# Patient Record
Sex: Female | Born: 1970 | Race: Asian | Hispanic: No | Marital: Married | Smoking: Former smoker
Health system: Southern US, Community
[De-identification: ages and names within clinical notes are randomized; demographics above are authoritative.]

## PROBLEM LIST (undated history)

## (undated) DIAGNOSIS — G5600 Carpal tunnel syndrome, unspecified upper limb: Secondary | ICD-10-CM

## (undated) DIAGNOSIS — D649 Anemia, unspecified: Secondary | ICD-10-CM

## (undated) DIAGNOSIS — C801 Malignant (primary) neoplasm, unspecified: Secondary | ICD-10-CM

## (undated) DIAGNOSIS — J45909 Unspecified asthma, uncomplicated: Secondary | ICD-10-CM

## (undated) DIAGNOSIS — Z803 Family history of malignant neoplasm of breast: Secondary | ICD-10-CM

## (undated) DIAGNOSIS — C50919 Malignant neoplasm of unspecified site of unspecified female breast: Secondary | ICD-10-CM

## (undated) DIAGNOSIS — T7840XA Allergy, unspecified, initial encounter: Secondary | ICD-10-CM

## (undated) DIAGNOSIS — Z800 Family history of malignant neoplasm of digestive organs: Secondary | ICD-10-CM

## (undated) DIAGNOSIS — S8990XA Unspecified injury of unspecified lower leg, initial encounter: Secondary | ICD-10-CM

## (undated) HISTORY — DX: Family history of malignant neoplasm of breast: Z80.3

## (undated) HISTORY — DX: Allergy, unspecified, initial encounter: T78.40XA

## (undated) HISTORY — DX: Unspecified asthma, uncomplicated: J45.909

## (undated) HISTORY — DX: Carpal tunnel syndrome, unspecified upper limb: G56.00

## (undated) HISTORY — DX: Family history of malignant neoplasm of digestive organs: Z80.0

---

## 2018-05-04 LAB — HM PAP SMEAR: HM Pap smear: NORMAL

## 2018-05-20 ENCOUNTER — Other Ambulatory Visit: Payer: Self-pay | Admitting: Certified Nurse Midwife

## 2018-05-20 DIAGNOSIS — Z1231 Encounter for screening mammogram for malignant neoplasm of breast: Secondary | ICD-10-CM

## 2018-07-25 ENCOUNTER — Ambulatory Visit: Payer: Self-pay | Attending: Certified Nurse Midwife

## 2019-05-15 ENCOUNTER — Ambulatory Visit: Payer: Self-pay | Attending: Internal Medicine

## 2019-05-15 DIAGNOSIS — Z23 Encounter for immunization: Secondary | ICD-10-CM

## 2019-05-15 NOTE — Progress Notes (Signed)
   Covid-19 Vaccination Clinic  Name:  Annette Le    MRN: [id number] DOB: 1970/10/13  05/15/2019  [name] was observed post Covid-19 immunization for 15 minutes without incident. She was provided with Vaccine Information Sheet and instruction to access the V-Safe system.   [name] was instructed to call 911 with any severe reactions post vaccine: Marland Kitchen Difficulty breathing  . Swelling of face and throat  . A fast heartbeat  . A bad rash all over body  . Dizziness and weakness   Immunizations Administered    Name Date Dose VIS Date Route   Pfizer COVID-19 Vaccine 05/15/2019  3:33 PM 0.3 mL 01/30/2019 Intramuscular   Manufacturer: [place]   Lot: [id number]   [place]: [id number]

## 2019-06-05 ENCOUNTER — Ambulatory Visit: Payer: Self-pay | Attending: Internal Medicine

## 2019-06-05 DIAGNOSIS — Z23 Encounter for immunization: Secondary | ICD-10-CM

## 2019-06-05 NOTE — Progress Notes (Signed)
   Covid-19 Vaccination Clinic  Name:  Annette Le    MRN: [id number] DOB: 01/07/1971  06/05/2019  Annette Le was observed post Covid-19 immunization for 15 minutes without incident. She was provided with Vaccine Information Sheet and instruction to access the V-Safe system.   Annette Le was instructed to call 911 with any severe reactions post vaccine: Marland Kitchen Difficulty breathing  . Swelling of face and throat  . A fast heartbeat  . A bad rash all over body  . Dizziness and weakness   Immunizations Administered    Name Date Dose VIS Date Route   Pfizer COVID-19 Vaccine 06/05/2019  3:56 PM 0.3 mL 01/30/2019 Intramuscular   Manufacturer: [place]   Lot: [id number]   [place]: [id number]

## 2019-11-02 ENCOUNTER — Ambulatory Visit
Admission: EM | Admit: 2019-11-02 | Discharge: 2019-11-02 | Disposition: A | Discharge status: Home / Self Care | Payer: 59 | Attending: Emergency Medicine | Admitting: Emergency Medicine

## 2019-11-02 DIAGNOSIS — S46819A Strain of other muscles, fascia and tendons at shoulder and upper arm level, unspecified arm, initial encounter: Secondary | ICD-10-CM

## 2019-11-02 HISTORY — DX: Unspecified injury of unspecified lower leg, initial encounter: S89.90XA

## 2019-11-02 MED ORDER — CYCLOBENZAPRINE HCL 5 MG PO TABS: 5.0000 mg | ORAL_TABLET | Freq: Every evening | ORAL | 0 refills | Status: DC | PRN

## 2019-11-02 MED ORDER — NAPROXEN 500 MG PO TABS: 500.0000 mg | ORAL_TABLET | Freq: Two times a day (BID) | ORAL | 0 refills | Status: DC

## 2019-11-02 NOTE — ED Provider Notes (Signed)
Roderic Palau    CSN: [id number] Arrival date & time: 11/02/19  0807      History   Chief Complaint Chief Complaint  Patient presents with  . Back Pain    HPI Annette Le is a 49 y.o. female  presents for bilateral upper back pain.  States pain is nonradiating, has been going on for the greater part of 2 weeks.  Has tried ibuprofen with pain out relief.  Denies trauma/injury to the affected area and does not recall an inciting event.  Denies fever, saddle area anesthesia, lower extremity numbness/weakness, urinary retention, fecal incontinence.  Does endorse heavy lifting at work.  No chest pain, difficulty breathing, neck pain.  Past Medical History:  Diagnosis Date  . Knee injury     There are no problems to display for this patient.   History reviewed. No pertinent surgical history.  OB History   No obstetric history on file.      Home Medications    Prior to Admission medications   Medication Sig Start Date End Date Taking? Authorizing Provider  cyclobenzaprine (FLEXERIL) 5 MG tablet Take 1 tablet (5 mg total) by mouth at bedtime as needed for muscle spasms. 11/02/19   Hall-Potvin, [place], PA-C  naproxen (NAPROSYN) 500 MG tablet Take 1 tablet (500 mg total) by mouth 2 (two) times daily. 11/02/19   Hall-Potvin, [place], PA-C    Family History No family history on file.  Social History Social History   Tobacco Use  . Smoking status: Never Smoker  Substance Use Topics  . Alcohol use: Yes  . Drug use: Not on file     Allergies   Shrimp [shellfish allergy]   Review of Systems As per HPI   Physical Exam Triage Vital Signs ED Triage Vitals  Enc Vitals Group     BP 11/02/19 0818 116/82     Pulse Rate 11/02/19 0818 78     Resp 11/02/19 0818 16     Temp 11/02/19 0818 97.8 F (36.6 C)     Temp src --      SpO2 11/02/19 0818 97 %     Weight --      Height --      Head Circumference --      Peak Flow --      Pain Score 11/02/19 0808  9     Pain Loc --      Pain Edu? --      Excl. in [place]? --    No data found.  Updated Vital Signs BP 116/82   Pulse 78   Temp 97.8 F (36.6 C)   Resp 16   LMP 10/19/2019 (Within Days)   SpO2 97%   Visual Acuity Right Eye Distance:   Left Eye Distance:   Bilateral Distance:    Right Eye Near:   Left Eye Near:    Bilateral Near:     Physical Exam Constitutional:      General: She is not in acute distress. HENT:     Head: Normocephalic and atraumatic.  Eyes:     General: No scleral icterus.    Pupils: Pupils are equal, round, and reactive to light.  Cardiovascular:     Rate and Rhythm: Normal rate.  Pulmonary:     Effort: Pulmonary effort is normal.  Musculoskeletal:     Cervical back: Normal range of motion. No tenderness.     Comments: Full active ROM of upper extremities bilaterally.  Neurovascular intact.  Patient does  not have spinous process tenderness in cervical or thoracic spine.  Does have significant trapezius tenderness without mass or crepitus (L>R)  Skin:    Coloration: Skin is not jaundiced or pale.  Neurological:     Mental Status: She is alert and oriented to person, place, and time.      UC Treatments / Results  Labs (all labs ordered are listed, but only abnormal results are displayed) Labs Reviewed - No data to display  EKG   Radiology No results found.  Procedures Procedures (including critical care time)  Medications Ordered in UC Medications - No data to display  Initial Impression / Assessment and Plan / UC Course  I have reviewed the triage vital signs and the nursing notes.  Pertinent labs & imaging results that were available during my care of the patient were reviewed by me and considered in my medical decision making (see chart for details).     H&P consistent with trapezius strain: We will treat supportively as outlined below.  Work note provided. Return precautions discussed, ptis verbalized understanding and is  agreeable to plan. Final Clinical Impressions(s) / UC Diagnoses   Final diagnoses:  Trapezius strain, unspecified laterality, initial encounter     Discharge Instructions     Heat therapy (hot compress, warm wash rag, hot showers, etc.) can help relax muscles and soothe muscle aches. Cold therapy (ice packs) can be used to help swelling both after injury and after prolonged use of areas of chronic pain/aches.  Pain medication:  500 mg Naprosyn/Aleve (naproxen) every 12 hours with food:  AVOID other NSAIDs while taking this (may have Tylenol).  May take muscle relaxer as needed for severe pain / spasm.  (This medication may cause you to become tired so it is important you do not drink alcohol or operate heavy machinery while on this medication.  Recommend your first dose to be taken before bedtime to monitor for side effects safely)  Important to follow up with specialist(s) below for further evaluation/management if your symptoms persist or worsen.    ED Prescriptions    Medication Sig Dispense Auth. Provider   cyclobenzaprine (FLEXERIL) 5 MG tablet Take 1 tablet (5 mg total) by mouth at bedtime as needed for muscle spasms. 15 tablet Hall-Potvin, [place], PA-C   naproxen (NAPROSYN) 500 MG tablet Take 1 tablet (500 mg total) by mouth 2 (two) times daily. 30 tablet Hall-Potvin, [place], PA-C     I have reviewed the PDMP during this encounter.   Hall-Potvin, [place], [place] 11/02/19 [phone]

## 2019-11-02 NOTE — Discharge Instructions (Signed)

## 2019-11-02 NOTE — ED Triage Notes (Signed)
Patient here with upper back pain x2 weeks. Reports she has been taking ibuprofen.

## 2019-12-03 ENCOUNTER — Encounter: Payer: Self-pay | Admitting: *Deleted

## 2019-12-03 ENCOUNTER — Other Ambulatory Visit: Payer: Self-pay

## 2019-12-03 ENCOUNTER — Ambulatory Visit
Admission: EM | Admit: 2019-12-03 | Discharge: 2019-12-03 | Disposition: A | Discharge status: Home / Self Care | Payer: 59 | Attending: Emergency Medicine | Admitting: Emergency Medicine

## 2019-12-03 DIAGNOSIS — M79601 Pain in right arm: Secondary | ICD-10-CM

## 2019-12-03 DIAGNOSIS — R202 Paresthesia of skin: Secondary | ICD-10-CM

## 2019-12-03 MED ORDER — IBUPROFEN 600 MG PO TABS: 600.0000 mg | ORAL_TABLET | Freq: Four times a day (QID) | ORAL | 0 refills | Status: DC | PRN

## 2019-12-03 NOTE — ED Triage Notes (Addendum)
Patient reports history of muscle strain to upper back pain aprox 1 month ago. Was seen here for initial visit and then followed up with [facility] per workers comp, was placed on prednisone and zanaflex.   13 days ago reports started having numbness/tingling to right and pointer finger to right hand. With time has spread to right shoulder and face. States that feeling sensation is different.

## 2019-12-03 NOTE — ED Provider Notes (Signed)
Roderic Palau    CSN: [id number] Arrival date & time: 12/03/19  1524      History   Chief Complaint Chief Complaint  Patient presents with  . Arm Pain    HPI Annette Le is a 49 y.o. female.   Patient presents with 2-week history of intermittent pain, numbness, paresthesias, and swelling of her right fingers, hand, arm, and shoulder.  She states the pain is intermittent and worse in the mornings after she wakes up.  She is not currently having any pain or other symptoms.  She also reports the pain intermittently spreads to her right face.  No treatments attempted at home.  She denies rash, lesions, erythema, warmth, fever, chills, or other symptoms.  Patient was seen here on 11/02/2019 by PA Hall-Potvin; diagnosed with trapezius strain; treated with Flexeril and Naprosyn.  Then seen at [facility] clinic; diagnosed with neck strain and back strain; treated with prednisone and Zanaflex.  She had follow-up visits on 11/10/2019 and 11/17/2019 at [facility] clinic.  She was cleared to return to work by [facility] on 11/17/2019.  She denies current pregnancy or breastfeeding.     The history is provided by the patient and medical records.    Past Medical History:  Diagnosis Date  . Knee injury     There are no problems to display for this patient.   History reviewed. No pertinent surgical history.  OB History   No obstetric history on file.      Home Medications    Prior to Admission medications   Medication Sig Start Date End Date Taking? Authorizing Provider  cyclobenzaprine (FLEXERIL) 5 MG tablet Take 1 tablet (5 mg total) by mouth at bedtime as needed for muscle spasms. 11/02/19  Yes Hall-Potvin, [place], PA-C  ibuprofen (ADVIL) 600 MG tablet Take 1 tablet (600 mg total) by mouth every 6 (six) hours as needed. 12/03/19   Sharion Balloon, NP    Family History History reviewed. No pertinent family history.  Social History Social History   Tobacco Use  .  Smoking status: Never Smoker  . Smokeless tobacco: Never Used  Substance Use Topics  . Alcohol use: Yes  . Drug use: Not on file     Allergies   Shrimp [shellfish allergy]   Review of Systems Review of Systems  Constitutional: Negative for chills and fever.  HENT: Negative for ear pain and sore throat.   Eyes: Negative for pain and visual disturbance.  Respiratory: Negative for cough and shortness of breath.   Cardiovascular: Negative for chest pain and palpitations.  Gastrointestinal: Negative for abdominal pain and vomiting.  Genitourinary: Negative for dysuria and hematuria.  Musculoskeletal: Positive for arthralgias. Negative for back pain.  Skin: Negative for color change and rash.  Neurological: Negative for seizures and syncope.  All other systems reviewed and are negative.    Physical Exam Triage Vital Signs ED Triage Vitals  Enc Vitals Group     BP 12/03/19 1538 108/75     Pulse Rate 12/03/19 1538 83     Resp 12/03/19 1538 15     Temp 12/03/19 1538 98.6 F (37 C)     Temp Source 12/03/19 1538 Oral     SpO2 12/03/19 1538 96 %     Weight --      Height --      Head Circumference --      Peak Flow --      Pain Score 12/03/19 1534 0  Pain Loc --      Pain Edu? --      Excl. in [place]? --    No data found.  Updated Vital Signs BP 108/75 (BP Location: Left Arm)   Pulse 83   Temp 98.6 F (37 C) (Oral)   Resp 15   SpO2 96%   Visual Acuity Right Eye Distance:   Left Eye Distance:   Bilateral Distance:    Right Eye Near:   Left Eye Near:    Bilateral Near:     Physical Exam Vitals and nursing note reviewed.  Constitutional:      General: She is not in acute distress.    Appearance: She is well-developed. She is not ill-appearing.  HENT:     Head: Normocephalic and atraumatic.     Mouth/Throat:     Mouth: Mucous membranes are moist.  Eyes:     Conjunctiva/sclera: Conjunctivae normal.  Cardiovascular:     Rate and Rhythm: Normal rate and  regular rhythm.     Heart sounds: No murmur heard.   Pulmonary:     Effort: Pulmonary effort is normal. No respiratory distress.     Breath sounds: Normal breath sounds.  Abdominal:     Palpations: Abdomen is soft.     Tenderness: There is no abdominal tenderness.  Musculoskeletal:        General: No swelling, tenderness, deformity or signs of injury. Normal range of motion.     Cervical back: Neck supple.  Skin:    General: Skin is warm and dry.     Capillary Refill: Capillary refill takes less than 2 seconds.     Findings: No bruising, erythema, lesion or rash.  Neurological:     General: No focal deficit present.     Mental Status: She is alert and oriented to person, place, and time.     Cranial Nerves: No cranial nerve deficit.     Sensory: No sensory deficit.     Motor: No weakness.     Coordination: Coordination normal.     Gait: Gait normal.  Psychiatric:        Mood and Affect: Mood normal.        Behavior: Behavior normal.      UC Treatments / Results  Labs (all labs ordered are listed, but only abnormal results are displayed) Labs Reviewed - No data to display  EKG   Radiology No results found.  Procedures Procedures (including critical care time)  Medications Ordered in UC Medications - No data to display  Initial Impression / Assessment and Plan / UC Course  I have reviewed the triage vital signs and the nursing notes.  Pertinent labs & imaging results that were available during my care of the patient were reviewed by me and considered in my medical decision making (see chart for details).   Right arm pain and paresthesias.  Patient is well-appearing and her exam is reassuring.  She is currently asymptomatic.  Treating with ibuprofen.  Instructed her to follow-up with orthopedics if her symptoms are not improving.  Patient agrees to plan of care.   Final Clinical Impressions(s) / UC Diagnoses   Final diagnoses:  Right arm pain  Paresthesias      Discharge Instructions     Take the ibuprofen as prescribed.    Follow up with an orthopedist if your symptoms are not improving.         ED Prescriptions    Medication Sig Dispense Auth. Provider  ibuprofen (ADVIL) 600 MG tablet Take 1 tablet (600 mg total) by mouth every 6 (six) hours as needed. 30 tablet Sharion Balloon, NP     PDMP not reviewed this encounter.   Sharion Balloon, NP 12/03/19 1600

## 2019-12-03 NOTE — Discharge Instructions (Signed)
Take the ibuprofen as prescribed.    Follow up with an orthopedist if your symptoms are not improving.

## 2020-02-03 ENCOUNTER — Ambulatory Visit: Payer: 59 | Admitting: Family Medicine

## 2020-02-10 ENCOUNTER — Other Ambulatory Visit: Payer: Self-pay

## 2020-02-10 ENCOUNTER — Ambulatory Visit (INDEPENDENT_AMBULATORY_CARE_PROVIDER_SITE_OTHER): Payer: 59 | Admitting: Family Medicine

## 2020-02-10 ENCOUNTER — Encounter: Payer: Self-pay | Admitting: Family Medicine

## 2020-02-10 VITALS — BP 108/71 | HR 73 | Temp 97.3°F | Resp 18 | Ht 61.5 in | Wt 159.6 lb

## 2020-02-10 DIAGNOSIS — Z7689 Persons encountering health services in other specified circumstances: Secondary | ICD-10-CM

## 2020-02-10 NOTE — Patient Instructions (Signed)
We will plan to see you back in 6 months for your physical and as needed if any acute concerns arise  You will receive a survey after today's visit either digitally by e-mail or paper by [place] mail. Your experiences and feedback matter to [place].  Please respond so we know how we are doing as we provide care for you.  Call us with any questions/concerns/needs.  It is my goal to be available to you for your health concerns.  Thanks for choosing me to be a partner in your healthcare needs!  Harlin Rain, FNP-C Family Nurse Practitioner [facility] Phone: [phone]

## 2020-02-10 NOTE — Assessment & Plan Note (Signed)
New patient establishment at [facility] for primary care.  Will request records from previous PCP with [facility] and [facility].  Will RTC PRN and in 6 months for CPE.

## 2020-02-10 NOTE — Progress Notes (Signed)
Subjective:    Patient ID: Annette Le, female    DOB: [birth year], 49 y.o.   MRN: [id number]  Annette Le is a 49 y.o. female presenting on 02/10/2020 for Establish Care   HPI  Mr. Riesen presents to clinic as new patient to establish for primary care.  Previous PCP was at [facility] and [facility].  Records will be be requested.  Past medical, family, and surgical history reviewed w/ pt.  She has no acute concerns today.  Depression screen PHQ 2/9 02/10/2020  Decreased Interest 0  Down, Depressed, Hopeless 0  PHQ - 2 Score 0    Social History   Tobacco Use  . Smoking status: Former Smoker    Quit date: 02/09/2005    Years since quitting: 15.0  . Smokeless tobacco: Never Used  Vaping Use  . Vaping Use: Never used  Substance Use Topics  . Alcohol use: Yes  . Drug use: Never    Review of Systems  Constitutional: Negative.   HENT: Negative.   Eyes: Negative.   Respiratory: Negative.   Cardiovascular: Negative.   Gastrointestinal: Negative.   Endocrine: Negative.   Genitourinary: Negative.   Musculoskeletal: Negative.   Skin: Negative.   Allergic/Immunologic: Negative.   Neurological: Negative.   Hematological: Negative.   Psychiatric/Behavioral: Negative.    Per HPI unless specifically indicated above     Objective:    BP 108/71 (BP Location: Right Arm, Patient Position: Sitting, Cuff Size: Normal)   Pulse 73   Temp (!) 97.3 F (36.3 C) (Temporal)   Resp 18   Ht 5' 1.5" (1.562 m)   Wt 159 lb 9.6 oz (72.4 kg)   SpO2 100%   BMI 29.67 kg/m   Wt Readings from Last 3 Encounters:  02/10/20 159 lb 9.6 oz (72.4 kg)    Physical Exam Vitals and nursing note reviewed.  Constitutional:      General: She is not in acute distress.    Appearance: Normal appearance. She is well-developed, well-groomed and overweight. She is not ill-appearing or toxic-appearing.  HENT:     Head: Normocephalic and atraumatic.     Nose:     Comments: Lesia Sago is in  place, covering mouth and nose. Eyes:     General: Lids are normal. Vision grossly intact.        Right eye: No discharge.        Left eye: No discharge.     Extraocular Movements: Extraocular movements intact.     Conjunctiva/sclera: Conjunctivae normal.     Pupils: Pupils are equal, round, and reactive to light.  Pulmonary:     Effort: Pulmonary effort is normal. No respiratory distress.  Skin:    General: Skin is warm and dry.     Capillary Refill: Capillary refill takes less than 2 seconds.  Neurological:     General: No focal deficit present.     Mental Status: She is alert and oriented to person, place, and time.  Psychiatric:        Attention and Perception: Attention and perception normal.        Mood and Affect: Mood and affect normal.        Speech: Speech normal.        Behavior: Behavior normal. Behavior is cooperative.        Thought Content: Thought content normal.        Cognition and Memory: Cognition and memory normal.        Judgment: Judgment normal.  No results found for this or any previous visit.    Assessment & Plan:   Problem List Items Addressed This Visit      Other   Encounter to establish care with new doctor - Primary    New patient establishment at [facility] for primary care.  Will request records from previous PCP with [facility] and [facility].  Will RTC PRN and in 6 months for CPE.         No orders of the defined types were placed in this encounter.  Follow up plan: Return in about 6 months (around 08/10/2020) for CPE.   Harlin Rain, [place] Family Nurse Practitioner [facility] 02/10/2020, 2:22 PM

## 2020-06-10 ENCOUNTER — Telehealth: Payer: Self-pay

## 2020-06-10 NOTE — Telephone Encounter (Signed)
The pt was scheduled to see Dr. Raliegh Ip on Monday.

## 2020-06-10 NOTE — Telephone Encounter (Signed)
Copied from [place] [id number]. Topic: General - Other >> Jun 10, 2020 11:01 AM Leward Quan A wrote: Reason for CRM: Patient called in to to say that she need to see a doctor asking if it was possible to see Dr Raliegh Ip would be able to see her on Monday 06/13/20. She states that she have a lump / mass in her right breast that is painful when she lifts and need to be checked Ph# [phone]

## 2020-06-13 ENCOUNTER — Ambulatory Visit (INDEPENDENT_AMBULATORY_CARE_PROVIDER_SITE_OTHER): Payer: 59 | Admitting: Family Medicine

## 2020-06-13 ENCOUNTER — Other Ambulatory Visit: Payer: Self-pay

## 2020-06-13 ENCOUNTER — Encounter: Payer: Self-pay | Admitting: Family Medicine

## 2020-06-13 VITALS — BP 143/112 | HR 76 | Temp 98.4°F | Ht 61.5 in | Wt 155.0 lb

## 2020-06-13 DIAGNOSIS — N644 Mastodynia: Secondary | ICD-10-CM | POA: Diagnosis not present

## 2020-06-13 DIAGNOSIS — N6311 Unspecified lump in the right breast, upper outer quadrant: Secondary | ICD-10-CM | POA: Diagnosis not present

## 2020-06-13 NOTE — Progress Notes (Addendum)
Subjective:    Patient ID: Annette Le, female    DOB: January 25, 1971, 50 y.o.   MRN: [id number]  Annette Le is a 50 y.o. female presenting on 06/13/2020 for Breast Mass (X 3 weeks, Right breast, Painful to touch, not sure if its getting bigger, stays in the same spot when touching, marble size, feels like its deep and not on the surface )  Previous PCP Cyndia Skeeters, FNP  Accompanied by husband.  HPI   Right Breast Mass/Lump, painful Reports onset symptoms about 3 weeks ago, with painful spot or lump identified, she thinks it might be increasing in size. Taking OTC Ibuprofen / Aleve PRN with some relief and it helps other symptoms.  - She has menstrual cycles, she noticed that the menstrual cycle occurred 2 weeks after the pain started, and she has had menstrual cycle regularly. - Family history with Maternal Aunt, in [place] - with breast cancer age 50+ has recovered. - Denies any fever chills, redness, rash, drainage.  Health Maintenance: No prior mammogram.  Depression screen [facility] 2/9 06/13/2020 02/10/2020  Decreased Interest 0 0  Down, Depressed, Hopeless 0 0  PHQ - 2 Score 0 0  Altered sleeping 1 -  Tired, decreased energy 1 -  Change in appetite 0 -  Feeling bad or failure about yourself  0 -  Trouble concentrating 0 -  Moving slowly or fidgety/restless 0 -  Suicidal thoughts 0 -  PHQ-9 Score 2 -    Social History   Tobacco Use  . Smoking status: Former Smoker    Quit date: 02/09/2005    Years since quitting: 15.3  . Smokeless tobacco: Never Used  Vaping Use  . Vaping Use: Never used  Substance Use Topics  . Alcohol use: Yes  . Drug use: Never    Review of Systems Per HPI unless specifically indicated above     Objective:    BP (!) 143/112   Pulse 76   Temp 98.4 F (36.9 C) (Oral)   Ht 5' 1.5" (1.562 m)   Wt 155 lb (70.3 kg)   LMP 06/13/2020   SpO2 100%   BMI 28.81 kg/m   Wt Readings from Last 3 Encounters:  06/13/20 155 lb (70.3 kg)   02/10/20 159 lb 9.6 oz (72.4 kg)    Physical Exam Vitals and nursing note reviewed.  Constitutional:      General: She is not in acute distress.    Appearance: She is well-developed. She is not diaphoretic.     Comments: Well-appearing, comfortable, cooperative  HENT:     Head: Normocephalic and atraumatic.  Eyes:     General:        Right eye: No discharge.        Left eye: No discharge.     Conjunctiva/sclera: Conjunctivae normal.  Cardiovascular:     Rate and Rhythm: Normal rate.  Pulmonary:     Effort: Pulmonary effort is normal.  Chest:    Skin:    General: Skin is warm and dry.     Findings: No erythema or rash.  Neurological:     Mental Status: She is alert and oriented to person, place, and time.  Psychiatric:        Behavior: Behavior normal.     Comments: Well groomed, good eye contact, normal speech and thoughts    Breast exam chaperoned by Randa Evens Person CMA  No results found for this or any previous visit.    Assessment & Plan:  Problem List Items Addressed This Visit   None   Visit Diagnoses    Mass of upper outer quadrant of right breast    -  Primary   Relevant Orders   MM DIAG BREAST TOMO BILATERAL   US BREAST LTD UNI RIGHT INC AXILLA   Breast pain, right       Relevant Orders   MM DIAG BREAST TOMO BILATERAL   US BREAST LTD UNI RIGHT INC AXILLA      New onset R breast pain nodule/mass as described 3 week duration Other areas of some pain but no palpable abnormality No prior mammogram screening at age 59. fam history breast cancer maternal aunt age >56  Proceed w/ Diagnostic Mammogram Bilateral + Right breast Ultrasound - orders signed for [facility].  Orders Placed This Encounter  Procedures  . MM DIAG BREAST TOMO BILATERAL    Standing Status:   Future    Standing Expiration Date:   12/13/2020    Order Specific Question:   Reason for Exam (SYMPTOM  OR DIAGNOSIS REQUIRED)    Answer:   right breast mass 1-2 cm central upper outer,  tender to palpation 3 weeks, no prior mammogram    Order Specific Question:   Preferred imaging location?    Answer:   [facility]    Order Specific Question:   Release to patient    Answer:   Immediate  . US BREAST LTD UNI RIGHT INC AXILLA    Standing Status:   Future    Standing Expiration Date:   12/13/2020    Order Specific Question:   Reason for Exam (SYMPTOM  OR DIAGNOSIS REQUIRED)    Answer:   right breast central upper outer 1-2 cm mass, tender, 3 weeks, no prior mammogram.    Order Specific Question:   Preferred imaging location?    Answer:   [facility]     No orders of the defined types were placed in this encounter.     Follow up plan: Return if symptoms worsen or fail to improve.   Nobie Putnam, [facility] 06/13/2020, 11:03 AM

## 2020-06-13 NOTE — Patient Instructions (Addendum)
Thank you for coming to the office today.  For Mammogram screening for breast cancer   Call the [facility] below anytime to schedule your own appointment now that order has been placed.  [facility] [place], [place] [address] Phone: [phone]  Diagnostic mammogram and Ultrasound ordered.  Again most of the time this type of problems can turn out benign or something that we need to monitor and watch. Stay tuned for updates from your results.  Please schedule a Follow-up Appointment to: Return if symptoms worsen or fail to improve.  If you have any other questions or concerns, please feel free to call the office or send a message through [place]. You may also schedule an earlier appointment if necessary.  Additionally, you may be receiving a survey about your experience at our office within a few days to 1 week by e-mail or mail. We value your feedback.  Nobie Putnam, DO [place]

## 2020-06-17 ENCOUNTER — Ambulatory Visit
Admission: RE | Admit: 2020-06-17 | Discharge: 2020-06-17 | Disposition: A | Discharge status: Home / Self Care | Payer: 59 | Source: Ambulatory Visit | Attending: Family Medicine | Admitting: Family Medicine

## 2020-06-17 ENCOUNTER — Other Ambulatory Visit: Payer: Self-pay

## 2020-06-17 DIAGNOSIS — N644 Mastodynia: Secondary | ICD-10-CM

## 2020-06-17 DIAGNOSIS — N6311 Unspecified lump in the right breast, upper outer quadrant: Secondary | ICD-10-CM | POA: Diagnosis not present

## 2020-06-20 ENCOUNTER — Other Ambulatory Visit: Payer: Self-pay | Admitting: Family Medicine

## 2020-06-20 DIAGNOSIS — N6310 Unspecified lump in the right breast, unspecified quadrant: Secondary | ICD-10-CM

## 2020-06-20 DIAGNOSIS — R928 Other abnormal and inconclusive findings on diagnostic imaging of breast: Secondary | ICD-10-CM

## 2020-06-22 ENCOUNTER — Ambulatory Visit
Admission: RE | Admit: 2020-06-22 | Discharge: 2020-06-22 | Disposition: A | Discharge status: Home / Self Care | Payer: 59 | Source: Ambulatory Visit | Attending: Family Medicine | Admitting: Family Medicine

## 2020-06-22 ENCOUNTER — Other Ambulatory Visit: Payer: Self-pay

## 2020-06-22 DIAGNOSIS — R928 Other abnormal and inconclusive findings on diagnostic imaging of breast: Secondary | ICD-10-CM | POA: Diagnosis not present

## 2020-06-22 DIAGNOSIS — N6310 Unspecified lump in the right breast, unspecified quadrant: Secondary | ICD-10-CM | POA: Diagnosis present

## 2020-06-22 HISTORY — PX: BREAST BIOPSY: SHX20

## 2020-06-23 ENCOUNTER — Other Ambulatory Visit: Payer: Self-pay | Admitting: Family Medicine

## 2020-06-23 DIAGNOSIS — C50919 Malignant neoplasm of unspecified site of unspecified female breast: Secondary | ICD-10-CM

## 2020-06-23 DIAGNOSIS — N6310 Unspecified lump in the right breast, unspecified quadrant: Secondary | ICD-10-CM

## 2020-06-23 DIAGNOSIS — R928 Other abnormal and inconclusive findings on diagnostic imaging of breast: Secondary | ICD-10-CM

## 2020-06-26 NOTE — Progress Notes (Signed)
Navigation initiated.  Patient scheduled for consults with Dr. Tasia Catchings and Dr.  Christian Mate on 06/28/20.

## 2020-06-28 ENCOUNTER — Other Ambulatory Visit: Payer: Self-pay

## 2020-06-28 ENCOUNTER — Ambulatory Visit: Payer: 59 | Admitting: Surgery

## 2020-06-28 ENCOUNTER — Encounter: Payer: Self-pay | Admitting: Oncology

## 2020-06-28 ENCOUNTER — Inpatient Hospital Stay: Payer: 59

## 2020-06-28 ENCOUNTER — Inpatient Hospital Stay: Payer: 59 | Attending: Oncology | Admitting: Oncology

## 2020-06-28 VITALS — BP 101/67 | HR 76 | Temp 98.0°F | Resp 16 | Ht 61.0 in | Wt 163.0 lb

## 2020-06-28 DIAGNOSIS — C50919 Malignant neoplasm of unspecified site of unspecified female breast: Secondary | ICD-10-CM

## 2020-06-28 DIAGNOSIS — Z809 Family history of malignant neoplasm, unspecified: Secondary | ICD-10-CM

## 2020-06-28 DIAGNOSIS — M549 Dorsalgia, unspecified: Secondary | ICD-10-CM | POA: Diagnosis not present

## 2020-06-28 DIAGNOSIS — D649 Anemia, unspecified: Secondary | ICD-10-CM | POA: Insufficient documentation

## 2020-06-28 DIAGNOSIS — Z7189 Other specified counseling: Secondary | ICD-10-CM

## 2020-06-28 DIAGNOSIS — C50411 Malignant neoplasm of upper-outer quadrant of right female breast: Secondary | ICD-10-CM | POA: Insufficient documentation

## 2020-06-28 LAB — CBC WITH DIFFERENTIAL/PLATELET
Abs Immature Granulocytes: 0.03 10*3/uL (ref 0.00–0.07)
Basophils Absolute: 0.1 10*3/uL (ref 0.0–0.1)
Basophils Relative: 1 %
Eosinophils Absolute: 0.5 10*3/uL (ref 0.0–0.5)
Eosinophils Relative: 7 %
HCT: 35 % — ABNORMAL LOW (ref 36.0–46.0)
Hemoglobin: 10.9 g/dL — ABNORMAL LOW (ref 12.0–15.0)
Immature Granulocytes: 1 %
Lymphocytes Relative: 33 %
Lymphs Abs: 2.1 10*3/uL (ref 0.7–4.0)
MCH: 27 pg (ref 26.0–34.0)
MCHC: 31.1 g/dL (ref 30.0–36.0)
MCV: 86.8 fL (ref 80.0–100.0)
Monocytes Absolute: 0.5 10*3/uL (ref 0.1–1.0)
Monocytes Relative: 8 %
Neutro Abs: 3.2 10*3/uL (ref 1.7–7.7)
Neutrophils Relative %: 50 %
Platelets: 326 10*3/uL (ref 150–400)
RBC: 4.03 MIL/uL (ref 3.87–5.11)
RDW: 13.1 % (ref 11.5–15.5)
WBC: 6.4 10*3/uL (ref 4.0–10.5)
nRBC: 0 % (ref 0.0–0.2)

## 2020-06-28 LAB — COMPREHENSIVE METABOLIC PANEL
ALT: 34 U/L (ref 0–44)
AST: 26 U/L (ref 15–41)
Albumin: 3.8 g/dL (ref 3.5–5.0)
Alkaline Phosphatase: 76 U/L (ref 38–126)
Anion gap: 9 (ref 5–15)
BUN: 10 mg/dL (ref 6–20)
CO2: 23 mmol/L (ref 22–32)
Calcium: 8.7 mg/dL — ABNORMAL LOW (ref 8.9–10.3)
Chloride: 104 mmol/L (ref 98–111)
Creatinine, Ser: 0.62 mg/dL (ref 0.44–1.00)
GFR, Estimated: >60 mL/min (ref >60)
Glucose, Bld: 116 mg/dL — ABNORMAL HIGH (ref 70–99)
Potassium: 4.1 mmol/L (ref 3.5–5.1)
Sodium: 136 mmol/L (ref 135–145)
Total Bilirubin: 0.6 mg/dL (ref 0.3–1.2)
Total Protein: 6.7 g/dL (ref 6.5–8.1)

## 2020-06-28 LAB — IRON AND TIBC
Iron: 214 ug/dL — ABNORMAL HIGH (ref 28–170)
Saturation Ratios: 49 % — ABNORMAL HIGH (ref 10.4–31.8)
TIBC: 438 ug/dL (ref 250–450)
UIBC: 224 ug/dL

## 2020-06-28 LAB — FERRITIN: Ferritin: 8 ng/mL — ABNORMAL LOW (ref 11–307)

## 2020-06-28 NOTE — Progress Notes (Signed)
New patient evaluation.   

## 2020-06-28 NOTE — Progress Notes (Signed)
Hematology/Oncology Consult note [facility] Telephone:([phone] [phone]   Patient Care Team: Olin Hauser, DO as PCP - General (Family Medicine)  REFERRING PROVIDER: Nobie Putnam *  CHIEF COMPLAINTS/REASON FOR VISIT:  Evaluation of breast cancer  HISTORY OF PRESENTING ILLNESS:   Annette Le is a  50 y.o.  female with PMH listed below was seen in consultation at the request of  Nobie Putnam *  for evaluation of breast cancer.   She felt a painful right breast nodule for about 6 weeks. Went to see primary care physician and mammogram was obtained.  06/17/2020 bilateral diagnostic mammogram showed 14 x 15 x 12 mm right upper outer breast mass, 11 o'clock 2cm from nipple. close to the site of palpable/painful concern. And 2 other probably benign mass at 12 o'clock.   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  Grade 2, DCIS not identified.  LVI not identified.  Patient is scheduled to have additional ultrasound-guided biopsy of the 2 masses at 12 o'clock position of the right breast. Patient presents to establish care discussed.  She has appointment scheduled to see surgery with Dr.Rodenberg.  She was accompanied by her husband.  Family history of breast cancer: Maternal aunt was diagnosed with breast cancer at the age of 68 Family history of other cancers: Maternal aunt stomach cancer, maternal grandfather pancreatic cancer, maternal aunt lung cancer-also a smoker.  Menarche: 50 years of age  premenopausal, LMP 06/22/2020 Number of pregnancies :  Age at first live childbirth: Used OCP: Remote use for a few months. Used estrogen and progesterone therapy: Denies History of Radiation to the chest: Denies Previous of breast biopsy: Denies   Review of Systems  Constitutional: Negative for appetite change, chills, fatigue and fever.  HENT:    Negative for hearing loss and voice change.   Eyes: Negative for eye problems.  Respiratory: Negative for chest tightness and cough.   Cardiovascular: Negative for chest pain.  Gastrointestinal: Negative for abdominal distention, abdominal pain and blood in stool.  Endocrine: Negative for hot flashes.  Genitourinary: Negative for difficulty urinating and frequency.   Musculoskeletal: Positive for back pain. Negative for arthralgias.  Skin: Negative for itching and rash.  Neurological: Negative for extremity weakness.  Hematological: Negative for adenopathy.  Psychiatric/Behavioral: Negative for confusion.  right breast nodule  MEDICAL HISTORY:  Past Medical History:  Diagnosis Date  . Allergy   . Asthma   . Carpal tunnel syndrome   . Knee injury     SURGICAL HISTORY: Past Surgical History:  Procedure Laterality Date  . BREAST BIOPSY Right 06/22/2020   u/s bx @ 11:00-"vision" clip-path pending    SOCIAL HISTORY: Social History   Socioeconomic History  . Marital status: Married    Spouse name: Not on file  . Number of children: Not on file  . Years of education: Not on file  . Highest education level: Not on file  Occupational History  . Not on file  Tobacco Use  . Smoking status: Former Smoker    Quit date: 10/11/2003    Years since quitting: 16.7  . Smokeless tobacco: Never Used  Vaping Use  . Vaping Use: Never used  Substance and Sexual Activity  . Alcohol use: Yes  . Drug use: Never  . Sexual activity: Not on file  Other Topics Concern  . Not on file  Social History Narrative  . Not on file   Social Determinants  of Health   Financial Resource Strain: Not on file  Food Insecurity: Not on file  Transportation Needs: Not on file  Physical Activity: Not on file  Stress: Not on file  Social Connections: Not on file  Intimate Partner Violence: Not on file    FAMILY HISTORY: Family History  Problem Relation Age of Onset  . Breast cancer Maternal Aunt  83       recovered  . Lung cancer Maternal Aunt   . Liver cancer Maternal Aunt     ALLERGIES:  is allergic to shrimp extract allergy skin test, ibuprofen, shrimp [shellfish allergy], and tylenol [acetaminophen].  MEDICATIONS:  Current Outpatient Medications  Medication Sig Dispense Refill  . Ascorbic Acid (VITAMIN C) 500 MG CAPS Take by mouth.    . Cholecalciferol (VITAMIN D-3) 125 MCG (5000 UT) TABS Take by mouth.    . vitamin E 1000 UNIT capsule Take 1,000 Units by mouth daily.     No current facility-administered medications for this visit.     PHYSICAL EXAMINATION: ECOG PERFORMANCE STATUS: 1 - Symptomatic but completely ambulatory Vitals:   06/28/20 0952  BP: 101/67  Pulse: 76  Resp: 16  Temp: 98 F (36.7 C)   Filed Weights   06/28/20 0952  Weight: 163 lb (73.9 kg)    Physical Exam  LABORATORY DATA:  I have reviewed the data as listed Lab Results  Component Value Date   WBC 6.4 06/28/2020   HGB 10.9 (L) 06/28/2020   HCT 35.0 (L) 06/28/2020   MCV 86.8 06/28/2020   PLT 326 06/28/2020   Recent Labs    06/28/20 1102  NA 136  K 4.1  CL 104  CO2 23  GLUCOSE 116*  BUN 10  CREATININE 0.62  CALCIUM 8.7*  GFRNONAA >60  PROT 6.7  ALBUMIN 3.8  AST 26  ALT 34  ALKPHOS 76  BILITOT 0.6   Iron/TIBC/Ferritin/ %Sat No results found for: IRON, TIBC, FERRITIN, IRONPCTSAT    RADIOGRAPHIC STUDIES: I have personally reviewed the radiological images as listed and agreed with the findings in the report. US BREAST LTD UNI RIGHT INC AXILLA  Result Date: 06/17/2020 CLINICAL DATA:  Palpable painful RIGHT breast lump. EXAM: DIGITAL DIAGNOSTIC BILATERAL MAMMOGRAM WITH TOMOSYNTHESIS AND CAD; ULTRASOUND RIGHT BREAST LIMITED TECHNIQUE: Bilateral digital diagnostic mammography and breast tomosynthesis was performed. The images were evaluated with computer-aided detection.; Targeted ultrasound examination of the right breast was performed COMPARISON:  None.  Baseline ACR  Breast Density Category c: The breast tissue is heterogeneously dense, which may obscure small masses. FINDINGS: Spot compression tomosynthesis views of the site of palpable concern demonstrate an obscured oval mass subjacent to the palpable marker. There is an oval circumscribed mass in the upper retroareolar breast at anterior depth. No suspicious mass, distortion, or microcalcifications are identified to suggest presence of malignancy in the LEFT breast. On physical exam, there is a firm mobile mass at the site of palpable concern in the RIGHT upper outer breast. Targeted ultrasound was performed of the site of palpable concern. At 11 o'clock 2 cm from the nipple, there is an oval heterogeneous predominantly hypoechoic mass with mildly angular margins. It measures 14 x 15 x 12 mm. Targeted ultrasound was performed of the upper retroareolar breast. There are 2 adjacent oval circumscribed hypoechoic masses. At 12 o'clock 1 cm from the nipple, one measures 6 x 2 x 4 mm while the second at 12 o'clock in the retroareolar breast measures 6 x 7 x 3 mm. These  masses are probably benign and likely reflect complicated cysts versus benign fibroadenomas. There are multiple benign cysts noted during real-time examination. Representative simple cysts are documented at 6 o'clock 1 cm from the nipple. They measure approximately 5 mm. Targeted ultrasound was performed of the RIGHT axilla. No suspicious axillary lymph nodes are seen. IMPRESSION: 1. There is an indeterminate 15 mm mass at the site of palpable/painful concern in the RIGHT upper outer breast. Recommend ultrasound-guided biopsy for definitive characterization. 2. With benign biopsy results, recommend follow-up mammogram and ultrasound of the RIGHT breast in 6 months to evaluate for stability of the 2 probably benign masses at 12 o'clock. This will establish 6 months of definitive stability. If malignant results, recommend aspiration with potential conversion to biopsy  of these 2 masses if this will alter clinical management. 3. No suspicious RIGHT axillary adenopathy. 4. No mammographic evidence of malignancy in the LEFT breast. RECOMMENDATION: 1. RIGHT breast ultrasound-guided biopsy x1 2. With benign biopsy results, RIGHT breast diagnostic mammogram and ultrasound in 6 months of the 2 additional probably benign masses at 12 o'clock. I have discussed the findings and recommendations with the patient. If applicable, a reminder letter will be sent to the patient regarding the next appointment. Patient will be scheduled for biopsy at her earliest convenience by the schedulers and ordering provider will be notified. BI-RADS CATEGORY  4: Suspicious. Electronically Signed   By: Valentino Saxon MD   On: 06/17/2020 15:59   MM DIAG BREAST TOMO BILATERAL  Result Date: 06/17/2020 CLINICAL DATA:  Palpable painful RIGHT breast lump. EXAM: DIGITAL DIAGNOSTIC BILATERAL MAMMOGRAM WITH TOMOSYNTHESIS AND CAD; ULTRASOUND RIGHT BREAST LIMITED TECHNIQUE: Bilateral digital diagnostic mammography and breast tomosynthesis was performed. The images were evaluated with computer-aided detection.; Targeted ultrasound examination of the right breast was performed COMPARISON:  None.  Baseline ACR Breast Density Category c: The breast tissue is heterogeneously dense, which may obscure small masses. FINDINGS: Spot compression tomosynthesis views of the site of palpable concern demonstrate an obscured oval mass subjacent to the palpable marker. There is an oval circumscribed mass in the upper retroareolar breast at anterior depth. No suspicious mass, distortion, or microcalcifications are identified to suggest presence of malignancy in the LEFT breast. On physical exam, there is a firm mobile mass at the site of palpable concern in the RIGHT upper outer breast. Targeted ultrasound was performed of the site of palpable concern. At 11 o'clock 2 cm from the nipple, there is an oval heterogeneous  predominantly hypoechoic mass with mildly angular margins. It measures 14 x 15 x 12 mm. Targeted ultrasound was performed of the upper retroareolar breast. There are 2 adjacent oval circumscribed hypoechoic masses. At 12 o'clock 1 cm from the nipple, one measures 6 x 2 x 4 mm while the second at 12 o'clock in the retroareolar breast measures 6 x 7 x 3 mm. These masses are probably benign and likely reflect complicated cysts versus benign fibroadenomas. There are multiple benign cysts noted during real-time examination. Representative simple cysts are documented at 6 o'clock 1 cm from the nipple. They measure approximately 5 mm. Targeted ultrasound was performed of the RIGHT axilla. No suspicious axillary lymph nodes are seen. IMPRESSION: 1. There is an indeterminate 15 mm mass at the site of palpable/painful concern in the RIGHT upper outer breast. Recommend ultrasound-guided biopsy for definitive characterization. 2. With benign biopsy results, recommend follow-up mammogram and ultrasound of the RIGHT breast in 6 months to evaluate for stability of the 2 probably benign masses  at 12 o'clock. This will establish 6 months of definitive stability. If malignant results, recommend aspiration with potential conversion to biopsy of these 2 masses if this will alter clinical management. 3. No suspicious RIGHT axillary adenopathy. 4. No mammographic evidence of malignancy in the LEFT breast. RECOMMENDATION: 1. RIGHT breast ultrasound-guided biopsy x1 2. With benign biopsy results, RIGHT breast diagnostic mammogram and ultrasound in 6 months of the 2 additional probably benign masses at 12 o'clock. I have discussed the findings and recommendations with the patient. If applicable, a reminder letter will be sent to the patient regarding the next appointment. Patient will be scheduled for biopsy at her earliest convenience by the schedulers and ordering provider will be notified. BI-RADS CATEGORY  4: Suspicious. Electronically  Signed   By: Valentino Saxon MD   On: 06/17/2020 15:59   MM CLIP PLACEMENT RIGHT  Result Date: 06/22/2020 CLINICAL DATA:  Post procedure mammogram for clip placement. EXAM: DIAGNOSTIC RIGHT MAMMOGRAM POST ULTRASOUND BIOPSY COMPARISON:  Previous exam(s). FINDINGS: Mammographic images were obtained following ultrasound guided biopsy of a mass in the right breast at 11 o'clock. The biopsy marking clip is in expected position at the site of biopsy. IMPRESSION: Appropriate positioning of the vision shaped biopsy marking clip at the site of biopsy in the right breast at 11 o'clock. Final Assessment: Post Procedure Mammograms for Marker Placement Electronically Signed   By: Audie Pinto M.D.   On: 06/22/2020 10:30   [place] RT BREAST BX W LOC DEV 1ST LESION IMG BX SPEC US GUIDE  Addendum Date: 06/23/2020   ADDENDUM REPORT: 06/23/2020 11:02 ADDENDUM: PATHOLOGY revealed: A. RIGHT BREAST, 11:00 2CMFN; ULTRASOUND-GUIDED BIOPSY: - INVASIVE MAMMARY CARCINOMA WITH MUCINOUS FEATURES. Size of invasive carcinoma: 8 mm in this sample. Grade 2. Ductal carcinoma in situ: Not identified. Lymphovascular invasion: Not identified. Pathology results are CONCORDANT with imaging findings, per Dr. Audie Pinto. Pathology results and recommendations below were discussed with patient by telephone on 06/23/2020. Patient reported biopsy site within normal limits with slight tenderness at the site, and no significant bruising. Post biopsy care instructions were reviewed, questions were answered and my direct phone number was provided to patient. Patient was instructed to call [facility] if any concerns or questions arise related to the biopsy. Recommendations: 1. Please schedule patient for TWO additional ultrasound guided biopsies of two masses at twelve o'clock position of RIGHT breast. 2. Surgical consultation. Request for surgical consultation relayed to Al Pimple RN and Tanya Nones RN at [facility] by Electa Sniff RN on 06/23/2020. Pathology results reported by Electa Sniff RN on 06/23/2020. Electronically Signed   By: Audie Pinto M.D.   On: 06/23/2020 11:02   Result Date: 06/23/2020 CLINICAL DATA:  50 year old female presenting for biopsy of a right breast mass. EXAM: ULTRASOUND GUIDED RIGHT BREAST CORE NEEDLE BIOPSY COMPARISON:  Previous exam(s). PROCEDURE: I met with the patient and we discussed the procedure of ultrasound-guided biopsy, including benefits and alternatives. We discussed the high likelihood of a successful procedure. We discussed the risks of the procedure, including infection, bleeding, tissue injury, clip migration, and inadequate sampling. Informed written consent was given. The usual time-out protocol was performed immediately prior to the procedure. Lesion quadrant: Upper outer quadrant Using sterile technique and 1% Lidocaine as local anesthetic, under direct ultrasound visualization, a 12 gauge spring-loaded device was used to perform biopsy of a mass in the right breast at 11 o'clock using a lateral approach. At the conclusion of the procedure a  vision tissue marker clip was deployed into the biopsy cavity. Follow up 2 view mammogram was performed and dictated separately. IMPRESSION: Ultrasound guided biopsy of a mass in the right breast at 11 o'clock. No apparent complications. Electronically Signed: By: Audie Pinto M.D. On: 06/22/2020 10:31      ASSESSMENT & PLAN:  1. Invasive carcinoma of breast ([place])   2. Family history of cancer   3. Goals of care, counseling/discussion   4. Anemia, unspecified type    Images and pathology results were reviewed and discussed with patient She has additional biopsies of the right breast 12:00 mass Right upper outer quadrant breast mass 11:00 biopsy is positive for invasive carcinoma of the breast.  ER/PR/HER2 status are pending.  Further management depending on the receptor status.    Family history of breast cancer,  pancreatic cancer, stomach cancer and personal history of breast cancer.  Recommend genetic testing.  She is interested to establish care with a counselor. Check CBC, CMP, CA 27.29, CA 15-3  Back pain. Monitor symptoms, recommend over the counter ibuprofen. If symptom persists, may consider images.  Anemia, normocytic. Check iron panel.   Orders Placed This Encounter  Procedures  . CBC with Differential/Platelet    Standing Status:   Future    Number of Occurrences:   1    Standing Expiration Date:   06/28/2021  . Comprehensive metabolic panel    Standing Status:   Future    Number of Occurrences:   1    Standing Expiration Date:   06/28/2021  . Cancer antigen 15-3    Standing Status:   Future    Number of Occurrences:   1    Standing Expiration Date:   06/28/2021  . Cancer antigen 27.29    Standing Status:   Future    Number of Occurrences:   1    Standing Expiration Date:   06/28/2021  . Ambulatory referral to Genetics    Referral Priority:   Urgent    Referral Type:   Consultation    Referral Reason:   Specialty Services Required    Referred to Provider:   Faith Rogue T    Number of Visits Requested:   1    All questions were answered. The patient knows to call the clinic with any problems questions or concerns.  cc Nobie Putnam *    Return of visit: 1 week after surgery.  Thank you for this kind referral and the opportunity to participate in the care of this patient. A copy of today's note is routed to referring provider    Earlie Server, MD, PhD Hematology Oncology [facility] at [facility] Pager- [phone] 06/28/2020

## 2020-06-28 NOTE — Progress Notes (Signed)
Met with patient and husband after visit with Dr. Tasia Catchings.  [facility] surgical consult moved until after additional biopsy performed.  Encouraged to call with navigation needs.

## 2020-06-28 NOTE — Addendum Note (Signed)
Addended by: Evelina Dun on: 06/28/2020 02:01 PM   Modules accepted: Orders

## 2020-06-29 ENCOUNTER — Inpatient Hospital Stay: Payer: 59

## 2020-06-29 ENCOUNTER — Encounter: Payer: Self-pay | Admitting: Licensed Clinical Social Worker

## 2020-06-29 ENCOUNTER — Ambulatory Visit
Admission: RE | Admit: 2020-06-29 | Discharge: 2020-06-29 | Disposition: A | Discharge status: Home / Self Care | Payer: 59 | Source: Ambulatory Visit | Attending: Family Medicine | Admitting: Family Medicine

## 2020-06-29 ENCOUNTER — Inpatient Hospital Stay (HOSPITAL_BASED_OUTPATIENT_CLINIC_OR_DEPARTMENT_OTHER): Payer: 59 | Admitting: Licensed Clinical Social Worker

## 2020-06-29 ENCOUNTER — Other Ambulatory Visit: Payer: Self-pay

## 2020-06-29 DIAGNOSIS — C50919 Malignant neoplasm of unspecified site of unspecified female breast: Secondary | ICD-10-CM

## 2020-06-29 DIAGNOSIS — R928 Other abnormal and inconclusive findings on diagnostic imaging of breast: Secondary | ICD-10-CM | POA: Diagnosis present

## 2020-06-29 DIAGNOSIS — Z803 Family history of malignant neoplasm of breast: Secondary | ICD-10-CM | POA: Diagnosis not present

## 2020-06-29 DIAGNOSIS — N6310 Unspecified lump in the right breast, unspecified quadrant: Secondary | ICD-10-CM | POA: Insufficient documentation

## 2020-06-29 DIAGNOSIS — Z800 Family history of malignant neoplasm of digestive organs: Secondary | ICD-10-CM

## 2020-06-29 HISTORY — PX: BREAST BIOPSY: SHX20

## 2020-06-29 LAB — CANCER ANTIGEN 27.29: CA 27.29: 12.9 U/mL (ref 0.0–38.6)

## 2020-06-29 LAB — CANCER ANTIGEN 15-3: CA 15-3: 9.6 U/mL (ref 0.0–25.0)

## 2020-06-29 NOTE — Progress Notes (Signed)
REFERRING PROVIDER: Earlie Server, MD [place],   [id number]  PRIMARY PROVIDER:  Olin Hauser, DO  PRIMARY REASON FOR VISIT:  1. Invasive carcinoma of breast ([place])   2. Family history of pancreatic cancer   3. Family history of colon cancer   4. Family history of stomach cancer   5. Family history of breast cancer      HISTORY OF PRESENT ILLNESS:   Annette Le, a 50 y.o. female, was seen for a [place] cancer genetics consultation at the request of Dr. Tasia Catchings due to a personal and family history of cancer.  Annette Le presents to clinic today to discuss the possibility of a hereditary predisposition to cancer, genetic testing, and to further clarify her future cancer risks, as well as potential cancer risks for family members.   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 The treatment plan is still being determined, patient has appointment today for further biopsies and consult with surgery scheduled 4/17. She does report she would use genetic testing to help with her surgical decision.    CANCER HISTORY:  Oncology History   No history exists.     RISK FACTORS:  Menarche was at age 10.  First live birth at age 72.  OCP use for few months Ovaries intact: yes.  Hysterectomy: no.  Menopausal status: premenopausal.  HRT use: 0 years. Colonoscopy: no; not examined. Mammogram within the last year: yes. Number of breast biopsies: 1.   Past Medical History:  Diagnosis Date  . Allergy   . Asthma   . Carpal tunnel syndrome   . Family history of breast cancer   . Family history of colon cancer   . Family history of pancreatic cancer   . Family history of stomach cancer   . Knee injury     Past Surgical History:  Procedure Laterality Date  . BREAST BIOPSY Right 06/22/2020   u/s bx @ 11:00-"vision" clip-path pending    Social History   Socioeconomic History  . Marital status: Married     Spouse name: Not on file  . Number of children: Not on file  . Years of education: Not on file  . Highest education level: Not on file  Occupational History  . Not on file  Tobacco Use  . Smoking status: Former Smoker    Quit date: 10/11/2003    Years since quitting: 16.7  . Smokeless tobacco: Never Used  Vaping Use  . Vaping Use: Never used  Substance and Sexual Activity  . Alcohol use: Yes  . Drug use: Never  . Sexual activity: Not on file  Other Topics Concern  . Not on file  Social History Narrative  . Not on file   Social Determinants of Health   Financial Resource Strain: Not on file  Food Insecurity: Not on file  Transportation Needs: Not on file  Physical Activity: Not on file  Stress: Not on file  Social Connections: Not on file     FAMILY HISTORY:  We obtained a detailed, 4-generation family history.  Significant diagnoses are listed below: Family History  Problem Relation Age of Onset  . Colon cancer Maternal Aunt   . Lung cancer Maternal Aunt   . Stomach cancer Maternal Aunt   . Cancer Father 54       intestinal vs. stomach  . Pancreatic cancer Maternal Grandfather        d.  29s  . Breast cancer Cousin    Annette Le has 1 daughter, 34. She has 3 brothers, 1 sister, and 2 paternal half brothers, 1 paternal half sister. None have had cancer.   Annette Le mother is living at 64, no cancer history. Patient had 5 maternal aunts, 4 maternal uncles. One aunt had stomach cancer over age 21, another aunt had lung cancer and died at 38, another had colon and died at 59. No known cancers in first cousins. Maternal grandmother passed at 60. Grandfather had pancreatic cancer and died in his 59s. His brother's daughter had breast cancer in her 17s.   Annette Le father died at 57, he had either intestinal or stomach cancer. No siblings for him. Paternal grandmother died in her 69s, patient has no info about paternal grandfather.   Annette Le is is no of previous  family history of genetic testing for hereditary cancer risks.  There is no reported Ashkenazi Jewish ancestry. There is no known consanguinity.    GENETIC COUNSELING ASSESSMENT: Annette Le is a 50 y.o. female with a personal and family history of cancer which is somewhat suggestive of a hereditary cancer syndrome and predisposition to cancer. We, therefore, discussed and recommended the following at today's visit.   DISCUSSION: We discussed that approximately 5-10% of breast cancer is hereditary  Most cases of hereditary breast cancer are associated with BRCA1/BRCA2 genes, although there are other genes associated with hereditary breast cancer as well as genes associated with colon, stomach, and pancreatic cancer .  We discussed that testing is beneficial for several reasons including surgical decision-making for breast cancer, knowing about other cancer risks, identifying potential screening and risk-reduction options that may be appropriate, and to understand if other family members could be at risk for cancer and allow them to undergo genetic testing.   We reviewed the characteristics, features and inheritance patterns of hereditary cancer syndromes. We also discussed genetic testing, including the appropriate family members to test, the process of testing, insurance coverage and turn-around-time for results. We discussed the implications of a negative, positive and/or variant of uncertain significant result. In order to get genetic test results in a timely manner so that Annette Le can use these genetic test results for surgical decisions, we recommended Annette Le pursue genetic testing for the [facility] Breast Cancer STAT Panel. Once complete, we recommend Annette Le pursue reflex genetic testing to the Multi-Cancer+RNA gene panel.   The STAT Breast cancer panel offered by [facility] includes sequencing and rearrangement analysis for the following 9 genes:  ATM, BRCA1, BRCA2, CDH1, CHEK2, PALB2, PTEN, STK11  and TP53.    The Multi-Cancer Panel offered by [facility] includes sequencing and/or deletion duplication testing of the following 84 genes: AIP, ALK, APC, ATM, AXIN2,BAP1,  BARD1, BLM, BMPR1A, BRCA1, BRCA2, BRIP1, CASR, CDC73, CDH1, CDK4, CDKN1B, CDKN1C, CDKN2A (p14ARF), CDKN2A (p16INK4a), CEBPA, CHEK2, CTNNA1, DICER1, DIS3L2, EGFR (c.2369C>T, p.Thr790Met variant only), EPCAM (Deletion/duplication testing only), FH, FLCN, GATA2, GPC3, GREM1 (Promoter region deletion/duplication testing only), HOXB13 (c.251G>A, p.Gly84Glu), HRAS, KIT, MAX, MEN1, MET, MITF (c.952G>A, p.Glu318Lys variant only), MLH1, MSH2, MSH3, MSH6, MUTYH, NBN, NF1, NF2, NTHL1, PALB2, PDGFRA, PHOX2B, PMS2, POLD1, POLE, POT1, PRKAR1A, PTCH1, PTEN, RAD50, RAD51C, RAD51D, RB1, RECQL4, RET, RUNX1, SDHAF2, SDHA (sequence changes only), SDHB, SDHC, SDHD, SMAD4, SMARCA4, SMARCB1, SMARCE1, STK11, SUFU, TERC, TERT, TMEM127, TP53, TSC1, TSC2, VHL, WRN and WT1.  Based on Annette Le's personal and family history of cancer, she meets medical criteria for genetic testing. Despite that she meets criteria,  she may still have an out of pocket cost.   PLAN: After considering the risks, benefits, and limitations, Annette Le provided informed consent to pursue genetic testing and the blood sample was sent to [facility] for analysis of the Breast Cancer STAT Panel + Multi-Cancer Panel. Results should be available within approximately 2-3 weeks' time, at which point they will be disclosed by telephone to Annette Le, as will any additional recommendations warranted by these results. Annette Le will receive a summary of her genetic counseling visit and a copy of her results once available. This information will also be available in Epic.   Annette Le questions were answered to her satisfaction today. Our contact information was provided should additional questions or concerns arise. Thank you for the referral and allowing [place] to share in the care of your  patient.   Faith Rogue, MS, [facility] Genetic Counselor [email or web address] Phone: [phone]  The patient was seen for a total of 30 minutes in face-to-face genetic counseling.  Dr. Grayland Ormond was available for discussion regarding this case.   _______________________________________________________________________ For Office Staff:  Number of people involved in session: 1 Was an Intern/ student involved with case: no

## 2020-06-30 LAB — SURGICAL PATHOLOGY

## 2020-07-05 ENCOUNTER — Other Ambulatory Visit: Payer: Self-pay

## 2020-07-05 ENCOUNTER — Encounter: Payer: Self-pay | Admitting: Surgery

## 2020-07-05 ENCOUNTER — Ambulatory Visit (INDEPENDENT_AMBULATORY_CARE_PROVIDER_SITE_OTHER): Payer: 59 | Admitting: Surgery

## 2020-07-05 ENCOUNTER — Telehealth: Payer: Self-pay

## 2020-07-05 VITALS — BP 105/70 | HR 77 | Temp 98.6°F | Ht 63.0 in | Wt 157.2 lb

## 2020-07-05 DIAGNOSIS — C50919 Malignant neoplasm of unspecified site of unspecified female breast: Secondary | ICD-10-CM | POA: Diagnosis not present

## 2020-07-05 NOTE — Progress Notes (Signed)
Patient ID: Annette Le, female   DOB: 1970/08/23, 50 y.o.   MRN: [id number]  Chief Complaint: Right breast cancer  History of Present Illness Annette Le is a 50 y.o. female with a palpable tender mass she appreciated early last month.  She had 2 biopsies separated by a week on May 4 and May 11.  Site at 11:00 came back malignant, that at 12:00 was benign. She reports 1 pregnancy, giving birth at the age of 12.  She has a family history of breast cancer involving a maternal cousin, diagnosed late in life at the age 28.  She denies any prior utilization of birth control or hormone therapy.  She is still menstruating.  She denies any history of nipple discharge or skin changes.   Past Medical History Past Medical History:  Diagnosis Date  . Allergy   . Asthma   . Carpal tunnel syndrome   . Family history of breast cancer   . Family history of colon cancer   . Family history of pancreatic cancer   . Family history of stomach cancer   . Knee injury       Past Surgical History:  Procedure Laterality Date  . BREAST BIOPSY Right 06/22/2020   u/s bx @ 11:00-"vision" clip-INVASIVE MAMMARY CARCINOMA WITH MUCINOUS FEATURES.  [place] Kitchen BREAST BIOPSY Right 06/29/2020   [place] bx at 12:00, x marker, path pending    Allergies  Allergen Reactions  . Shrimp Extract Allergy Skin Test Hives  . Ibuprofen Hives  . Shrimp [Shellfish Allergy] Hives  . Tylenol [Acetaminophen] Hives    Current Outpatient Medications  Medication Sig Dispense Refill  . Ascorbic Acid (VITAMIN C) 500 MG CAPS Take by mouth.    . Cholecalciferol (VITAMIN D-3) 125 MCG (5000 UT) TABS Take by mouth.    . vitamin E 1000 UNIT capsule Take 1,000 Units by mouth daily.     No current facility-administered medications for this visit.    Family History Family History  Problem Relation Age of Onset  . Colon cancer Maternal Aunt   . Lung cancer Maternal Aunt   . Stomach cancer Maternal Aunt   . Cancer Father 61        intestinal vs. stomach  . Pancreatic cancer Maternal Grandfather        d. 39s  . Breast cancer Cousin       Social History Social History   Tobacco Use  . Smoking status: Former Smoker    Quit date: 10/11/2003    Years since quitting: 16.7  . Smokeless tobacco: Never Used  Vaping Use  . Vaping Use: Never used  Substance Use Topics  . Alcohol use: Yes  . Drug use: Never        Review of Systems  Constitutional: Negative.   HENT: Negative.   Eyes: Negative.   Respiratory: Negative.   Cardiovascular: Negative.   Gastrointestinal: Negative.   Genitourinary: Negative.   Skin: Negative.   Neurological: Negative.   Psychiatric/Behavioral: Negative.       Physical Exam Blood pressure 105/70, pulse 77, temperature 98.6 F (37 C), temperature source Oral, height $RemoveBefo'5\' 3"'DEZWyBMLaOU$  (1.6 m), weight 157 lb 3.2 oz (71.3 kg), last menstrual period 06/13/2020, SpO2 97 %. Last Weight  Most recent update: 07/05/2020  9:59 AM   Weight  71.3 kg (157 lb 3.2 oz)            CONSTITUTIONAL: Well developed, and nourished, appropriately responsive and aware without distress.   EYES: Sclera non-icteric.  EARS, NOSE, MOUTH AND THROAT: Mask worn.  Hearing is intact to voice.  NECK: Trachea is midline, and there is no jugular venous distension.  LYMPH NODES:  Lymph nodes in the neck are not enlarged. RESPIRATORY:  Lungs are clear, and breath sounds are equal bilaterally. Normal respiratory effort without pathologic use of accessory muscles. CARDIOVASCULAR: Heart is regular in rate and rhythm. GI: The abdomen is soft, nontender, and nondistended. GU: The right breast is centrally bruised and still quite tender to exam. MUSCULOSKELETAL:  Symmetrical muscle tone appreciated in all four extremities.    SKIN: Skin turgor is normal. No pathologic skin lesions appreciated.  NEUROLOGIC:  Motor and sensation appear grossly normal.  Cranial nerves are grossly without defect. PSYCH:  Alert and oriented to  person, place and time. Affect is appropriate for situation.  Data Reviewed I have personally reviewed what is currently available of the patient's imaging, recent labs and medical records.   Labs:  CBC Latest Ref Rng & Units 06/28/2020  WBC 4.0 - 10.5 K/uL 6.4  Hemoglobin 12.0 - 15.0 g/dL 10.9(L)  Hematocrit 36.0 - 46.0 % 35.0(L)  Platelets 150 - 400 K/uL 326   CMP Latest Ref Rng & Units 06/28/2020  Glucose 70 - 99 mg/dL 116(H)  BUN 6 - 20 mg/dL 10  Creatinine 0.44 - 1.00 mg/dL 0.62  Sodium 135 - 145 mmol/L 136  Potassium 3.5 - 5.1 mmol/L 4.1  Chloride 98 - 111 mmol/L 104  CO2 22 - 32 mmol/L 23  Calcium 8.9 - 10.3 mg/dL 8.7(L)  Total Protein 6.5 - 8.1 g/dL 6.7  Total Bilirubin 0.3 - 1.2 mg/dL 0.6  Alkaline Phos 38 - 126 U/L 76  AST 15 - 41 U/L 26  ALT 0 - 44 U/L 34    SURGICAL PATHOLOGY  * THIS IS AN ADDENDUM REPORT *  CASE: [id number]  PATIENT: Annette Le  Surgical Pathology Report  *Addendum *   Reason for Addendum #1: Breast Biomarker Results   Specimen Submitted:  A. Breast, right   Clinical History: Vision clip within circumscribed mass, right breast  11:00 position.       DIAGNOSIS:  A. RIGHT BREAST, 11:00 2CMFN; ULTRASOUND-GUIDED BIOPSY:  - INVASIVE MAMMARY CARCINOMA WITH MUCINOUS FEATURES.   Size of invasive carcinoma: 8 mm in this sample  Histologic grade of invasive carcinoma: Grade 2            Glandular/tubular differentiation score: 3            Nuclear pleomorphism score: 2            Mitotic rate score: 1            Total score: 6  Ductal carcinoma in situ: Not identified  Lymphovascular invasion: Not identified   Comment:  The definitive grade will be assigned on the excisional specimen.  ER/PR/HER2 immunohistochemistry will be performed on block A1, with  reflex to [place] for HER2 2+. The results will be reported in an addendum.    GROSS DESCRIPTION:  A. Labeled: Right breast 11:00 2 cm  from nipple  Received: Formalin  Time/date in fixative: Collected and placed in formalin at 10:10 AM on  06/22/2020  Cold ischemic time: Less than 1 minute  Total fixation time: 7.25 hours  Core pieces: 4 cores and multiple additional fragments  Size: Range from 1.1-1.4 cm in length and range from 0.3-0.4 cm in  diameter  Description: Received are cores and fragments of yellow fibrofatty  tissue with scattered  areas of hemorrhage. The additional fragments are  0.5 x 0.3 x 0.2 cm in aggregate.  Ink color: Green  Entirely submitted in cassettes 1-2 with 2 cores in cassette 1 and 2  cores with the remaining fragments in cassette 2.   RB 06/22/2020    Final Diagnosis performed by Betsy Pries, MD.  Electronically signed  06/23/2020 9:45:44AM  The electronic signature indicates that the named Attending Pathologist  has evaluated the specimen  Technical component performed at [facility], [address] [place],  [place] [address] Lab: [phone] Dir: Rush Farmer, MD, MMM  Professional component performed at [facility], [facility], [place], [place], [place] [address] Lab: [phone]  Dir: Dellia Nims. Rubinas, MD   ADDENDUM:  CASE SUMMARY: BREAST BIOMARKER TESTS  TEST(S) PERFORMED:  Estrogen Receptor (ER) Status: POSITIVE      Percentage of cells with nuclear positivity: Greater than 90%      Average intensity of staining: Strong   Progesterone Receptor (PgR) Status: POSITIVE      Percentage of cells with nuclear positivity: Greater than 90%      Average intensity of staining: Strong   HER2 (by immunohistochemistry): NEGATIVE (Score 0)   SURGICAL PATHOLOGY  CASE: [id number]  PATIENT: [facility] [place]  Surgical Pathology Report      Specimen Submitted:  A. Breast, right   Clinical History: 50 YO F with newly DX'ed right breast cancer.  Biopsied additional 1 cm upper retro areolar right breast mass. X-shaped  clip placed following  ultrasound guided biopsy of RIGHT breast at 12:00,  retroareolar.     DIAGNOSIS:  A. BREAST, RIGHT AT 12:00, RETROAREOLAR; ULTRASOUND-GUIDED CORE NEEDLE  BIOPSY:  - BENIGN MAMMARY PARENCHYMA WITH FIBROCYSTIC AND APOCRINE CHANGES, WITH  ASSOCIATED USUAL DUCTAL HYPERPLASIA AND CALCIFICATIONS.  - NEGATIVE FOR ATYPICAL PROLIFERATIVE BREAST DISEASE.   Comment:  The patient's recent diagnosis of invasive mammary carcinoma with  mucinous features ([id number]) is noted. The current case is reviewed  together with the prior pathology. Malignant features are not  identified in the current specimen.   Imaging: Radiology review:   CLINICAL DATA:  Palpable painful RIGHT breast lump.  EXAM: DIGITAL DIAGNOSTIC BILATERAL MAMMOGRAM WITH TOMOSYNTHESIS AND CAD; ULTRASOUND RIGHT BREAST LIMITED  TECHNIQUE: Bilateral digital diagnostic mammography and breast tomosynthesis was performed. The images were evaluated with computer-aided detection.; Targeted ultrasound examination of the right breast was performed  COMPARISON:  None.  Baseline  ACR Breast Density Category c: The breast tissue is heterogeneously dense, which may obscure small masses.  FINDINGS: Spot compression tomosynthesis views of the site of palpable concern demonstrate an obscured oval mass subjacent to the palpable marker. There is an oval circumscribed mass in the upper retroareolar breast at anterior depth. No suspicious mass, distortion, or microcalcifications are identified to suggest presence of malignancy in the LEFT breast.  On physical exam, there is a firm mobile mass at the site of palpable concern in the RIGHT upper outer breast.  Targeted ultrasound was performed of the site of palpable concern. At 11 o'clock 2 cm from the nipple, there is an oval heterogeneous predominantly hypoechoic mass with mildly angular margins. It measures 14 x 15 x 12 mm.  Targeted ultrasound was performed of the upper  retroareolar breast. There are 2 adjacent oval circumscribed hypoechoic masses. At 12 o'clock 1 cm from the nipple, one measures 6 x 2 x 4 mm while the second at 12 o'clock in the retroareolar breast measures 6 x 7 x 3 mm. These  masses are probably benign and likely reflect complicated cysts versus benign fibroadenomas.  There are multiple benign cysts noted during real-time examination. Representative simple cysts are documented at 6 o'clock 1 cm from the nipple. They measure approximately 5 mm.  Targeted ultrasound was performed of the RIGHT axilla. No suspicious axillary lymph nodes are seen.  IMPRESSION: 1. There is an indeterminate 15 mm mass at the site of palpable/painful concern in the RIGHT upper outer breast. Recommend ultrasound-guided biopsy for definitive characterization. 2. With benign biopsy results, recommend follow-up mammogram and ultrasound of the RIGHT breast in 6 months to evaluate for stability of the 2 probably benign masses at 12 o'clock. This will establish 6 months of definitive stability. If malignant results, recommend aspiration with potential conversion to biopsy of these 2 masses if this will alter clinical management. 3. No suspicious RIGHT axillary adenopathy. 4. No mammographic evidence of malignancy in the LEFT breast.  RECOMMENDATION: 1. RIGHT breast ultrasound-guided biopsy x1 2. With benign biopsy results, RIGHT breast diagnostic mammogram and ultrasound in 6 months of the 2 additional probably benign masses at 12 o'clock.  I have discussed the findings and recommendations with the patient. If applicable, a reminder letter will be sent to the patient regarding the next appointment. Patient will be scheduled for biopsy at her earliest convenience by the schedulers and ordering provider will be notified.  BI-RADS CATEGORY  4: Suspicious.  Electronically Signed: By: Valentino Saxon MD On: 06/17/2020 15:59  Assessment     RIGHT BREAST, 11:00 2CMFN; - INVASIVE MAMMARY CARCINOMA WITH MUCINOUS FEATURES.  ER/PR positive, HER2 negative.  Estimated 15 mm in size based on ultrasound, clinically negative axilla, and ultrasound negative axilla.  Patient Active Problem List   Diagnosis Date Noted  . Family history of pancreatic cancer   . Family history of colon cancer   . Family history of stomach cancer   . Family history of breast cancer   . Goals of care, counseling/discussion 06/28/2020  . Invasive carcinoma of breast ([place]) 06/28/2020  . Family history of cancer 06/28/2020  . Anemia 06/28/2020  . Encounter to establish care with new doctor 02/10/2020    Plan    I have been advised to proceed with obtaining a breast MRI, so we will withhold further discussion of recommendations for breast conservation versus mastectomy.  Also awaiting completion of genetic evaluation. We will have her follow-up with results of breast MRI as soon as possible. Face-to-face time spent with the patient and accompanying care providers(if present) was 30 minutes, with more than 50% of the time spent counseling, educating, and coordinating care of the patient.    These notes generated with voice recognition software. I apologize for typographical errors.  Ronny Bacon M.D., FACS 07/05/2020, 10:25 AM

## 2020-07-05 NOTE — Patient Instructions (Addendum)
An MRI has been placed. You will get a call with appointment and results. If you have any concerns or questions, please feel free to call our office.   Breast Self-Awareness Breast self-awareness is knowing how your breasts look and feel. Doing breast self-awareness is important. It allows you to catch a breast problem early while it is still small and can be treated. All women should do breast self-awareness, including women who have had breast implants. Tell your doctor if you notice a change in your breasts. What you need:  A mirror.  A well-lit room. How to do a breast self-exam A breast self-exam is one way to learn what is normal for your breasts and to check for changes. To do a breast self-exam: Look for changes 1. Take off all the clothes above your waist. 2. Stand in front of a mirror in a room with good lighting. 3. Put your hands on your hips. 4. Push your hands down. 5. Look at your breasts and nipples in the mirror to see if one breast or nipple looks different from the other. Check to see if: ? The shape of one breast is different. ? The size of one breast is different. ? There are wrinkles, dips, and bumps in one breast and not the other. 6. Look at each breast for changes in the skin, such as: ? Redness. ? Scaly areas. 7. Look for changes in your nipples, such as: ? Liquid around the nipples. ? Bleeding. ? Dimpling. ? Redness. ? A change in where the nipples are.   Feel for changes 1. Lie on your back on the floor. 2. Feel each breast. To do this, follow these steps: ? Pick a breast to feel. ? Put the arm closest to that breast above your head. ? Use your other arm to feel the nipple area of your breast. Feel the area with the pads of your three middle fingers by making small circles with your fingers. For the first circle, press lightly. For the second circle, press harder. For the third circle, press even harder. ? Keep making circles with your fingers at the  different pressures as you move down your breast. Stop when you feel your ribs. ? Move your fingers a little toward the center of your body. ? Start making circles with your fingers again, this time going up until you reach your collarbone. ? Keep making up-and-down circles until you reach your armpit. Remember to keep using the three pressures. ? Feel the other breast in the same way. 3. Sit or stand in the tub or shower. 4. With soapy water on your skin, feel each breast the same way you did in step 2 when you were lying on the floor.   Write down what you find Writing down what you find can help you remember what to tell your doctor. Write down:  What is normal for each breast.  Any changes you find in each breast, including: ? The kind of changes you find. ? Whether you have pain. ? Size and location of any lumps.  When you last had your menstrual period. General tips  Check your breasts every month.  If you are breastfeeding, the best time to check your breasts is after you feed your baby or after you use a breast pump.  If you get menstrual periods, the best time to check your breasts is 5-7 days after your menstrual period is over.  With time, you will become comfortable with the  self-exam, and you will begin to know if there are changes in your breasts. Contact a doctor if you:  See a change in the shape or size of your breasts or nipples.  See a change in the skin of your breast or nipples, such as red or scaly skin.  Have fluid coming from your nipples that is not normal.  Find a lump or thick area that was not there before.  Have pain in your breasts.  Have any concerns about your breast health. Summary  Breast self-awareness includes looking for changes in your breasts, as well as feeling for changes within your breasts.  Breast self-awareness should be done in front of a mirror in a well-lit room.  You should check your breasts every month. If you get  menstrual periods, the best time to check your breasts is 5-7 days after your menstrual period is over.  Let your doctor know of any changes you see in your breasts, including changes in size, changes on the skin, pain or tenderness, or fluid from your nipples that is not normal. This information is not intended to replace advice given to you by your health care provider. Make sure you discuss any questions you have with your health care provider. Document Revised: 09/24/2017 Document Reviewed: 09/24/2017 Elsevier Patient Education  [place].

## 2020-07-05 NOTE — Telephone Encounter (Signed)
-----  Message from Theodore Demark, RN sent at 07/04/2020  5:42 PM EDT ----- Looks like she has first appointment with surgeon tomorrow.  Will let you know if scheduled at that visit. Anne ----- Message ----- From: Evelina Dun, RN Sent: 07/04/2020   9:44 AM EDT To: Vanice Sarah, CMA, Rico Junker, RN, #  Hello Anne/ Vita Barley,   Will one of you call pt to let her know this please? Do you guys know if she has  surgery date set?   Thanks,  Benjamine Mola  ----- Message ----- From: Earlie Server, MD Sent: 07/02/2020   2:53 PM EDT To: Evelina Dun, RN, Vanice Sarah, CMA  Please let her know that the breast cancer express ER+, PR+, HER2- ( status is pending during her appt). Her second biopsy on 5/11 did not show cancer.  Recommend surgery, and please arrange her to see me 2 weeks after surgery. Please remind me 3 days after surgery is done, I may need to send oncotype dx.

## 2020-07-05 NOTE — Telephone Encounter (Signed)
Sheena L called pt on 5/16 and per Sheena, "I have reviewed ER/PR + and Her2- status with patient, and that the biopsy on 06/29/20 was negative for malignancy. She states radiology recommended MRI. There is a recommendation on the biopsy procedure that recommends bilateral breast MRI. I told patient either you or Dr. Christian Mate will order the MRI for her. She sees him tomorrow. She is to let us know her surgery date."  Pt had appt with Dr. Christian Mate on 5/17 and MRI breast was ordered and is scheduled for 5/20. No surgery date yet.

## 2020-07-06 ENCOUNTER — Telehealth: Payer: Self-pay | Admitting: Licensed Clinical Social Worker

## 2020-07-08 ENCOUNTER — Other Ambulatory Visit: Payer: Self-pay

## 2020-07-08 ENCOUNTER — Ambulatory Visit
Admission: RE | Admit: 2020-07-08 | Discharge: 2020-07-08 | Disposition: A | Discharge status: Home / Self Care | Payer: 59 | Source: Ambulatory Visit | Attending: Surgery | Admitting: Surgery

## 2020-07-08 DIAGNOSIS — C50919 Malignant neoplasm of unspecified site of unspecified female breast: Secondary | ICD-10-CM | POA: Insufficient documentation

## 2020-07-08 MED ORDER — GADOBUTROL 1 MMOL/ML IV SOLN
7.0000 mL | Freq: Once | INTRAVENOUS | Status: AC | PRN
  Administered 2020-07-08: 7 mL via INTRAVENOUS

## 2020-07-12 ENCOUNTER — Other Ambulatory Visit: Payer: Self-pay | Admitting: Surgery

## 2020-07-12 ENCOUNTER — Encounter: Payer: Self-pay | Admitting: Surgery

## 2020-07-12 ENCOUNTER — Ambulatory Visit: Payer: Self-pay | Admitting: Surgery

## 2020-07-12 ENCOUNTER — Other Ambulatory Visit: Payer: Self-pay

## 2020-07-12 ENCOUNTER — Other Ambulatory Visit: Payer: Self-pay | Admitting: Family Medicine

## 2020-07-12 ENCOUNTER — Ambulatory Visit (INDEPENDENT_AMBULATORY_CARE_PROVIDER_SITE_OTHER): Payer: 59 | Admitting: Surgery

## 2020-07-12 VITALS — BP 128/73 | HR 81 | Temp 98.8°F | Ht 63.0 in | Wt 158.4 lb

## 2020-07-12 DIAGNOSIS — C50411 Malignant neoplasm of upper-outer quadrant of right female breast: Secondary | ICD-10-CM

## 2020-07-12 DIAGNOSIS — R928 Other abnormal and inconclusive findings on diagnostic imaging of breast: Secondary | ICD-10-CM

## 2020-07-12 DIAGNOSIS — Z170 Estrogen receptor positive status [ER+]: Secondary | ICD-10-CM

## 2020-07-12 DIAGNOSIS — N6310 Unspecified lump in the right breast, unspecified quadrant: Secondary | ICD-10-CM

## 2020-07-12 NOTE — Patient Instructions (Addendum)
Our surgery scheduler Pamala Hurry will call you within 24-48 hours to get you scheduled. If you have not heard from her after 48 hours, please call our office. You will not need to get Covid tested before surgery and have the blue sheet available when she calls to write down important information.  If you have any concerns or questions, please feel free to call our office.    Surgical Options for Early-Stage Breast Cancer  Surgery is usually the first treatment for early-stage breast cancer. Most women have two surgery options. One is called partial mastectomy, or breast-sparing or breast-conserving surgery, and the other is called mastectomy. Both surgeries have good survival rates. Breast cancer is different for everyone, even in its early stage. The best treatment for one person might not be the best treatment for another. Learn as much as you can about your cancer and work closely with your health care providers to make the choice that produces the best results for you. What is partial mastectomy? Partial mastectomy, also called breast-sparing surgery or breast-conserving surgery, is surgery to remove the cancer along with some normal breast tissue that surrounds it. Lymph nodes from under the arm may also be removed and tested to find out if the cancer has spread. If cancer is located near the chest wall, part of the chest wall lining may also be removed. What is a mastectomy? A mastectomy is surgery to remove the cancer along with the entire breast tissue. There are several types of mastectomy:  Simple or total mastectomy. In this surgery the entire breast is removed, including breast tissue, nipple, areola and skin around the breast. Some lymph nodes may also be removed from under the arm. If cancer is located near the chest wall, part of the chest wall lining may also be removed.  Skin-sparing mastectomy. In this surgery the breast tissue, nipple, and areola are removed and most of the skin over  the breast is left in place. This surgery results in less scar tissue than other mastectomy surgeries, which allows for a more natural breast reconstruction.  Nipple-sparing mastectomy. In this surgery, breast tissue is removed but the skin and nipple is left in place. The tissue under the nipple and areola may be removed if cancer is found in the area. This may be an option for women who choose to have breast reconstruction after mastectomy.  Modified radical mastectomy. This surgery is the same as a simple mastectomy but also includes removing lymph nodes from under the arm (axillary lymph node dissection).  Radical mastectomy. In this surgery the entire breast, the lymph nodes under the arm, and the chest wall muscles under the breast are removed. This surgery is rarely done now. A modified radical mastectomy is preferred because it is just as effective, but with the added advantage of fewer side effects. What are some advantages and disadvantages of these surgeries? Partial mastectomy Advantages of partial mastectomy include:  Keeping most of your breast tissue intact, allowing for a more natural look to the breast.  Easier recovery when compared to a mastectomy.  Ability to go home on the day of the procedure. Disadvantages of partial mastectomy include:  Slightly higher risk that your cancer will come back.  Needing more surgery at a later time.  Requiring radiation therapy after surgery, which has side effects and possible complications. This is done to reduce the chances of breast cancer returning. Mastectomy Advantages of a mastectomy include:  Not needing to have radiation therapy or other  treatments after surgery.  Lower chances of your cancer coming back. Disadvantages of a mastectomy include:  Longer recovery time compared to partial mastectomy.  Possibility of more complications.  Requiring additional surgeries to reconstruct your breast. Questions to ask Here are  some questions to ask about each surgery:  What will my recovery be like?  How will my breast look and feel?  What are the possible risks and complications of the surgery?  What additional treatment might I need after surgery?  What are the risks and complications of radiation therapy?  What are the risks and complications of chemotherapy?  Will I be able to have breast reconstruction? Where to find more information  [place]: https://www.cancer.gov  American Cancer Society: http://www.cancer.org Summary  Surgery is usually the first treatment for early-stage breast cancer. Most women have two surgery options.  One option is called partial mastectomy, or breast-sparing or breast-conserving surgery, and the other is called mastectomy. Both surgeries have good survival rates.  Each option has advantages and disadvantages to consider. The best treatment for one person might not be the best treatment for you.  Learn as much as you can about your cancer and work closely with your health care providers to make the choice that produces the best results for you. This information is not intended to replace advice given to you by your health care provider. Make sure you discuss any questions you have with your health care provider. Document Revised: 07/22/2019 Document Reviewed: 07/22/2019 Elsevier Patient Education  2021 [employer].

## 2020-07-12 NOTE — Progress Notes (Signed)
Patient ID: Annette Le, female   DOB: [birth year], 50 y.o.   MRN: [id number]  Chief Complaint: Right breast cancer  History of Present Illness  Annette Le is a 50 y.o. female returns today following her breast MRI.  She has a history of a palpable tender mass she appreciated early last month.  She had 2 biopsies separated by a week on May 4 and May 11.  Site at 11:00 came back malignant, that at 12:00 was benign. She reports 1 pregnancy, giving birth at the age of 14.  She has a family history of breast cancer involving a maternal cousin, diagnosed late in life at the age 10.  She denies any prior utilization of birth control or hormone therapy.  She is still menstruating.  She denies any history of nipple discharge or skin changes.   Past Medical History Past Medical History:  Diagnosis Date  . Allergy   . Asthma   . Carpal tunnel syndrome   . Family history of breast cancer   . Family history of colon cancer   . Family history of pancreatic cancer   . Family history of stomach cancer   . Knee injury       Past Surgical History:  Procedure Laterality Date  . BREAST BIOPSY Right 06/22/2020   u/s bx @ 11:00-"vision" clip-INVASIVE MAMMARY CARCINOMA WITH MUCINOUS FEATURES.  Marland Kitchen BREAST BIOPSY Right 06/29/2020   [place] bx at 12:00, x marker, path pending    Allergies  Allergen Reactions  . Shrimp Extract Allergy Skin Test Hives  . Ibuprofen Hives  . Shrimp [Shellfish Allergy] Hives  . Tylenol [Acetaminophen] Hives    Current Outpatient Medications  Medication Sig Dispense Refill  . Ascorbic Acid (VITAMIN C) 500 MG CAPS Take by mouth.    . Cholecalciferol (VITAMIN D-3) 125 MCG (5000 UT) TABS Take by mouth.    . vitamin E 1000 UNIT capsule Take 1,000 Units by mouth daily.     No current facility-administered medications for this visit.    Family History Family History  Problem Relation Age of Onset  . Colon cancer Maternal Aunt   . Lung cancer Maternal Aunt   . Stomach  cancer Maternal Aunt   . Cancer Father 57       intestinal vs. stomach  . Pancreatic cancer Maternal Grandfather        d. 63s  . Breast cancer Cousin       Social History Social History   Tobacco Use  . Smoking status: Former Smoker    Quit date: 10/11/2003    Years since quitting: 16.7  . Smokeless tobacco: Never Used  Vaping Use  . Vaping Use: Never used  Substance Use Topics  . Alcohol use: Yes  . Drug use: Never        Review of Systems  Constitutional: Negative.   HENT: Negative.   Eyes: Negative.   Respiratory: Negative.   Cardiovascular: Negative.   Gastrointestinal: Negative.   Genitourinary: Negative.   Skin: Negative.   Neurological: Negative.   Psychiatric/Behavioral: Negative.       Physical Exam Blood pressure 128/73, pulse 81, temperature 98.8 F (37.1 C), temperature source Oral, height 5' 3"  (1.6 m), weight 158 lb 6.4 oz (71.8 kg), last menstrual period 07/07/2020, SpO2 98 %. Last Weight  Most recent update: 07/12/2020  1:56 PM   Weight  71.8 kg (158 lb 6.4 oz)            CONSTITUTIONAL: Well developed, and nourished,  appropriately responsive and aware without distress.   EYES: Sclera non-icteric.   EARS, NOSE, MOUTH AND THROAT: Mask worn.  Hearing is intact to voice.  NECK: Trachea is midline, and there is no jugular venous distension.  LYMPH NODES:  Lymph nodes in the neck are not enlarged. RESPIRATORY:  Lungs are clear, and breath sounds are equal bilaterally. Normal respiratory effort without pathologic use of accessory muscles. CARDIOVASCULAR: Heart is regular in rate and rhythm. GI: The abdomen is soft, nontender, and nondistended. GU: The right breast is centrally bruised and still quite tender to exam. MUSCULOSKELETAL:  Symmetrical muscle tone appreciated in all four extremities.    SKIN: Skin turgor is normal. No pathologic skin lesions appreciated.  NEUROLOGIC:  Motor and sensation appear grossly normal.  Cranial nerves are grossly  without defect. PSYCH:  Alert and oriented to person, place and time. Affect is appropriate for situation.  Data Reviewed I have personally reviewed what is currently available of the patient's imaging, recent labs and medical records.   Labs:  CBC Latest Ref Rng & Units 06/28/2020  WBC 4.0 - 10.5 K/uL 6.4  Hemoglobin 12.0 - 15.0 g/dL 10.9(L)  Hematocrit 36.0 - 46.0 % 35.0(L)  Platelets 150 - 400 K/uL 326   CMP Latest Ref Rng & Units 06/28/2020  Glucose 70 - 99 mg/dL 116(H)  BUN 6 - 20 mg/dL 10  Creatinine 0.44 - 1.00 mg/dL 0.62  Sodium 135 - 145 mmol/L 136  Potassium 3.5 - 5.1 mmol/L 4.1  Chloride 98 - 111 mmol/L 104  CO2 22 - 32 mmol/L 23  Calcium 8.9 - 10.3 mg/dL 8.7(L)  Total Protein 6.5 - 8.1 g/dL 6.7  Total Bilirubin 0.3 - 1.2 mg/dL 0.6  Alkaline Phos 38 - 126 U/L 76  AST 15 - 41 U/L 26  ALT 0 - 44 U/L 34    SURGICAL PATHOLOGY  * THIS IS AN ADDENDUM REPORT *  CASE: [id number]  PATIENT: [place]  Surgical Pathology Report  *Addendum *   Reason for Addendum #1: Breast Biomarker Results   Specimen Submitted:  A. Breast, right   Clinical History: Vision clip within circumscribed mass, right breast  11:00 position.       DIAGNOSIS:  A. RIGHT BREAST, 11:00 2CMFN; ULTRASOUND-GUIDED BIOPSY:  - INVASIVE MAMMARY CARCINOMA WITH MUCINOUS FEATURES.   Size of invasive carcinoma: 8 mm in this sample  Histologic grade of invasive carcinoma: Grade 2            Glandular/tubular differentiation score: 3            Nuclear pleomorphism score: 2            Mitotic rate score: 1            Total score: 6  Ductal carcinoma in situ: Not identified  Lymphovascular invasion: Not identified   Comment:  The definitive grade will be assigned on the excisional specimen.  ER/PR/HER2 immunohistochemistry will be performed on block A1, with  reflex to [place] for HER2 2+. The results will be reported in an addendum.    GROSS  DESCRIPTION:  A. Labeled: Right breast 11:00 2 cm from nipple  Received: Formalin  Time/date in fixative: Collected and placed in formalin at 10:10 AM on  06/22/2020  Cold ischemic time: Less than 1 minute  Total fixation time: 7.25 hours  Core pieces: 4 cores and multiple additional fragments  Size: Range from 1.1-1.4 cm in length and range from 0.3-0.4 cm in  diameter  Description: Received are cores and fragments of yellow fibrofatty  tissue with scattered areas of hemorrhage. The additional fragments are  0.5 x 0.3 x 0.2 cm in aggregate.  Ink color: Green  Entirely submitted in cassettes 1-2 with 2 cores in cassette 1 and 2  cores with the remaining fragments in cassette 2.   RB 06/22/2020    Final Diagnosis performed by Betsy Pries, MD.  Electronically signed  06/23/2020 9:45:44AM  The electronic signature indicates that the named Attending Pathologist  has evaluated the specimen  Technical component performed at [facility], [address] [place],  [place] [address] Lab: [phone] Dir: Rush Farmer, MD, MMM  Professional component performed at [facility], [facility], [place], [place], [place] [address] Lab: [phone]  Dir: Dellia Nims. Rubinas, MD   ADDENDUM:  CASE SUMMARY: BREAST BIOMARKER TESTS  TEST(S) PERFORMED:  Estrogen Receptor (ER) Status: POSITIVE      Percentage of cells with nuclear positivity: Greater than 90%      Average intensity of staining: Strong   Progesterone Receptor (PgR) Status: POSITIVE      Percentage of cells with nuclear positivity: Greater than 90%      Average intensity of staining: Strong   HER2 (by immunohistochemistry): NEGATIVE (Score 0)   SURGICAL PATHOLOGY  CASE: [id number]  PATIENT: [place]  Surgical Pathology Report      Specimen Submitted:  A. Breast, right   Clinical History: 50 YO F with newly DX'ed right breast cancer.  Biopsied additional 1 cm upper retro areolar  right breast mass. X-shaped  clip placed following ultrasound guided biopsy of RIGHT breast at 12:00,  retroareolar.     DIAGNOSIS:  A. BREAST, RIGHT AT 12:00, RETROAREOLAR; ULTRASOUND-GUIDED CORE NEEDLE  BIOPSY:  - BENIGN MAMMARY PARENCHYMA WITH FIBROCYSTIC AND APOCRINE CHANGES, WITH  ASSOCIATED USUAL DUCTAL HYPERPLASIA AND CALCIFICATIONS.  - NEGATIVE FOR ATYPICAL PROLIFERATIVE BREAST DISEASE.   Comment:  The patient's recent diagnosis of invasive mammary carcinoma with  mucinous features ([id number]) is noted. The current case is reviewed  together with the prior pathology. Malignant features are not  identified in the current specimen.   Imaging: Radiology review:   CLINICAL DATA:  Palpable painful RIGHT breast lump.  EXAM: DIGITAL DIAGNOSTIC BILATERAL MAMMOGRAM WITH TOMOSYNTHESIS AND CAD; ULTRASOUND RIGHT BREAST LIMITED  TECHNIQUE: Bilateral digital diagnostic mammography and breast tomosynthesis was performed. The images were evaluated with computer-aided detection.; Targeted ultrasound examination of the right breast was performed  COMPARISON:  None.  Baseline  ACR Breast Density Category c: The breast tissue is heterogeneously dense, which may obscure small masses.  FINDINGS: Spot compression tomosynthesis views of the site of palpable concern demonstrate an obscured oval mass subjacent to the palpable marker. There is an oval circumscribed mass in the upper retroareolar breast at anterior depth. No suspicious mass, distortion, or microcalcifications are identified to suggest presence of malignancy in the LEFT breast.  On physical exam, there is a firm mobile mass at the site of palpable concern in the RIGHT upper outer breast.  Targeted ultrasound was performed of the site of palpable concern. At 11 o'clock 2 cm from the nipple, there is an oval heterogeneous predominantly hypoechoic mass with mildly angular margins. It measures 14 x 15 x 12  mm.  Targeted ultrasound was performed of the upper retroareolar breast. There are 2 adjacent oval circumscribed hypoechoic masses. At 12 o'clock 1 cm from the nipple, one measures 6 x 2 x 4 mm while the second at 12  o'clock in the retroareolar breast measures 6 x 7 x 3 mm. These masses are probably benign and likely reflect complicated cysts versus benign fibroadenomas.  There are multiple benign cysts noted during real-time examination. Representative simple cysts are documented at 6 o'clock 1 cm from the nipple. They measure approximately 5 mm.  Targeted ultrasound was performed of the RIGHT axilla. No suspicious axillary lymph nodes are seen.  IMPRESSION: 1. There is an indeterminate 15 mm mass at the site of palpable/painful concern in the RIGHT upper outer breast. Recommend ultrasound-guided biopsy for definitive characterization. 2. With benign biopsy results, recommend follow-up mammogram and ultrasound of the RIGHT breast in 6 months to evaluate for stability of the 2 probably benign masses at 12 o'clock. This will establish 6 months of definitive stability. If malignant results, recommend aspiration with potential conversion to biopsy of these 2 masses if this will alter clinical management. 3. No suspicious RIGHT axillary adenopathy. 4. No mammographic evidence of malignancy in the LEFT breast.  RECOMMENDATION: 1. RIGHT breast ultrasound-guided biopsy x1 2. With benign biopsy results, RIGHT breast diagnostic mammogram and ultrasound in 6 months of the 2 additional probably benign masses at 12 o'clock.  I have discussed the findings and recommendations with the patient. If applicable, a reminder letter will be sent to the patient regarding the next appointment. Patient will be scheduled for biopsy at her earliest convenience by the schedulers and ordering provider will be notified.  BI-RADS CATEGORY  4: Suspicious.  Electronically Signed: By: Valentino Saxon MD On: 06/17/2020 15:59  CLINICAL DATA:  Recent diagnosis of invasive mammary carcinoma with mucinous features right upper outer quadrant. Benign ultrasound core biopsy 12 o'clock periareolar region right breast. History states BRCA positive.  LABS:  None.  EXAM: BILATERAL BREAST MRI WITH AND WITHOUT CONTRAST  TECHNIQUE: Multiplanar, multisequence MR images of both breasts were obtained prior to and following the intravenous administration of 7 ml of Gadavist.  Three-dimensional MR images were rendered by post-processing of the original MR data on an independent workstation. The three-dimensional MR images were interpreted, and findings are reported in the following complete MRI report for this study. Three dimensional images were evaluated at the independent interpreting workstation using the DynaCAD thin client.  COMPARISON:  Previous exam(s).  FINDINGS: Breast composition: c. Heterogeneous fibroglandular tissue.  Background parenchymal enhancement: Moderate.  Right breast: Evidence of known 1.5 cm biopsy-proven malignancy over the middle third of the upper outer quadrant with central clip artifact. No additional suspicious masses or abnormal enhancement. Few small cysts are present.  Left breast: No mass or abnormal enhancement. Few small cysts are present.  Lymph nodes: No abnormal appearing lymph nodes.  Ancillary findings:  None.  IMPRESSION: Known biopsy-proven malignancy over the upper outer quadrant of the right breast. No additional suspicious masses or abnormal enhancement within either breast. Recommend continued management per clinical treatment plan.  RECOMMENDATION: Recommend continued management per clinical treatment plan.  BI-RADS CATEGORY  6: Known biopsy-proven malignancy.   Electronically Signed   By: Marin Olp M.D.   On: 07/08/2020 11:22   Assessment    RIGHT BREAST, 11:00 2CMFN; - INVASIVE MAMMARY  CARCINOMA WITH MUCINOUS FEATURES.  ER/PR positive, HER2 negative.  Estimated 15 mm in size based on ultrasound, clinically negative axilla, and ultrasound negative axilla.  Patient Active Problem List   Diagnosis Date Noted  . Family history of pancreatic cancer   . Family history of colon cancer   . Family history of stomach cancer   .  Family history of breast cancer   . Goals of care, counseling/discussion 06/28/2020  . Invasive carcinoma of breast ([place]) 06/28/2020  . Family history of cancer 06/28/2020  . Anemia 06/28/2020  . Encounter to establish care with new doctor 02/10/2020    Plan    I reviewed again the available options with the patient. The risk of recurrence is similar between mastectomy and lumpectomy with radiation.  I also discussed that given the small size of the cancer would recommend localization lumpectomy with radiation to follow.  I also discussed that we would need to do a sentinel lymph node biopsy to check the nodes.   Explained to the patient that after her surgical treatment additional treatment will depend on her prognostic indicators and stage.   We reviewed options and if she would prefer mastectomy, plastic surgery consultation would be feasible and reconstruction a possibility.  I believe she and her husband have thoughtfully considered both options.  I discussed risks of bleeding, infection, damage to surrounding tissues, having positive margins, needing further resection, damage to nerves causing arm numbness or difficulty raising arm, causing lymphedema in the arm; as well as anesthesia risks of MI, stroke, prolonged ventilation, pulmonary embolism, thrombosis and even death.   Patient was given the opportunity to ask questions and have them answered.  They would like to proceed with right breast RFID localized lumpectomy with sentinel lymph node biopsy.   These notes generated with voice recognition software. I apologize for typographical errors.  Ronny Bacon M.D., FACS 07/12/2020, 3:18 PM

## 2020-07-12 NOTE — H&P (View-Only) (Signed)
Patient ID: Annette Le, female   DOB: [birth year], 49 y.o.   MRN: [id number]  Chief Complaint: Right breast cancer  History of Present Illness  Annette Le is a 50 y.o. female returns today following her breast MRI.  She has a history of a palpable tender mass she appreciated early last month.  She had 2 biopsies separated by a week on May 4 and May 11.  Site at 11:00 came back malignant, that at 12:00 was benign. She reports 1 pregnancy, giving birth at the age of 80.  She has a family history of breast cancer involving a maternal cousin, diagnosed late in life at the age 41.  She denies any prior utilization of birth control or hormone therapy.  She is still menstruating.  She denies any history of nipple discharge or skin changes.   Past Medical History Past Medical History:  Diagnosis Date  . Allergy   . Asthma   . Carpal tunnel syndrome   . Family history of breast cancer   . Family history of colon cancer   . Family history of pancreatic cancer   . Family history of stomach cancer   . Knee injury       Past Surgical History:  Procedure Laterality Date  . BREAST BIOPSY Right 06/22/2020   u/s bx @ 11:00-"vision" clip-INVASIVE MAMMARY CARCINOMA WITH MUCINOUS FEATURES.  Marland Kitchen BREAST BIOPSY Right 06/29/2020   [place] bx at 12:00, x marker, path pending    Allergies  Allergen Reactions  . Shrimp Extract Allergy Skin Test Hives  . Ibuprofen Hives  . Shrimp [Shellfish Allergy] Hives  . Tylenol [Acetaminophen] Hives    Current Outpatient Medications  Medication Sig Dispense Refill  . Ascorbic Acid (VITAMIN C) 500 MG CAPS Take by mouth.    . Cholecalciferol (VITAMIN D-3) 125 MCG (5000 UT) TABS Take by mouth.    . vitamin E 1000 UNIT capsule Take 1,000 Units by mouth daily.     No current facility-administered medications for this visit.    Family History Family History  Problem Relation Age of Onset  . Colon cancer Maternal Aunt   . Lung cancer Maternal Aunt   . Stomach  cancer Maternal Aunt   . Cancer Father 13       intestinal vs. stomach  . Pancreatic cancer Maternal Grandfather        d. 52s  . Breast cancer Cousin       Social History Social History   Tobacco Use  . Smoking status: Former Smoker    Quit date: 10/11/2003    Years since quitting: 16.7  . Smokeless tobacco: Never Used  Vaping Use  . Vaping Use: Never used  Substance Use Topics  . Alcohol use: Yes  . Drug use: Never        Review of Systems  Constitutional: Negative.   HENT: Negative.   Eyes: Negative.   Respiratory: Negative.   Cardiovascular: Negative.   Gastrointestinal: Negative.   Genitourinary: Negative.   Skin: Negative.   Neurological: Negative.   Psychiatric/Behavioral: Negative.       Physical Exam Blood pressure 128/73, pulse 81, temperature 98.8 F (37.1 C), temperature source Oral, height 5' 3"  (1.6 m), weight 158 lb 6.4 oz (71.8 kg), last menstrual period 07/07/2020, SpO2 98 %. Last Weight  Most recent update: 07/12/2020  1:56 PM   Weight  71.8 kg (158 lb 6.4 oz)            CONSTITUTIONAL: Well developed, and nourished,  appropriately responsive and aware without distress.   EYES: Sclera non-icteric.   EARS, NOSE, MOUTH AND THROAT: Mask worn.  Hearing is intact to voice.  NECK: Trachea is midline, and there is no jugular venous distension.  LYMPH NODES:  Lymph nodes in the neck are not enlarged. RESPIRATORY:  Lungs are clear, and breath sounds are equal bilaterally. Normal respiratory effort without pathologic use of accessory muscles. CARDIOVASCULAR: Heart is regular in rate and rhythm. GI: The abdomen is soft, nontender, and nondistended. GU: The right breast is centrally bruised and still quite tender to exam. MUSCULOSKELETAL:  Symmetrical muscle tone appreciated in all four extremities.    SKIN: Skin turgor is normal. No pathologic skin lesions appreciated.  NEUROLOGIC:  Motor and sensation appear grossly normal.  Cranial nerves are grossly  without defect. PSYCH:  Alert and oriented to person, place and time. Affect is appropriate for situation.  Data Reviewed I have personally reviewed what is currently available of the patient's imaging, recent labs and medical records.   Labs:  CBC Latest Ref Rng & Units 06/28/2020  WBC 4.0 - 10.5 K/uL 6.4  Hemoglobin 12.0 - 15.0 g/dL 10.9(L)  Hematocrit 36.0 - 46.0 % 35.0(L)  Platelets 150 - 400 K/uL 326   CMP Latest Ref Rng & Units 06/28/2020  Glucose 70 - 99 mg/dL 116(H)  BUN 6 - 20 mg/dL 10  Creatinine 0.44 - 1.00 mg/dL 0.62  Sodium 135 - 145 mmol/L 136  Potassium 3.5 - 5.1 mmol/L 4.1  Chloride 98 - 111 mmol/L 104  CO2 22 - 32 mmol/L 23  Calcium 8.9 - 10.3 mg/dL 8.7(L)  Total Protein 6.5 - 8.1 g/dL 6.7  Total Bilirubin 0.3 - 1.2 mg/dL 0.6  Alkaline Phos 38 - 126 U/L 76  AST 15 - 41 U/L 26  ALT 0 - 44 U/L 34    SURGICAL PATHOLOGY  * THIS IS AN ADDENDUM REPORT *  CASE: [id number]  PATIENT: Annette Le  Surgical Pathology Report  *Addendum *   Reason for Addendum #1: Breast Biomarker Results   Specimen Submitted:  A. Breast, right   Clinical History: Vision clip within circumscribed mass, right breast  11:00 position.       DIAGNOSIS:  A. RIGHT BREAST, 11:00 2CMFN; ULTRASOUND-GUIDED BIOPSY:  - INVASIVE MAMMARY CARCINOMA WITH MUCINOUS FEATURES.   Size of invasive carcinoma: 8 mm in this sample  Histologic grade of invasive carcinoma: Grade 2            Glandular/tubular differentiation score: 3            Nuclear pleomorphism score: 2            Mitotic rate score: 1            Total score: 6  Ductal carcinoma in situ: Not identified  Lymphovascular invasion: Not identified   Comment:  The definitive grade will be assigned on the excisional specimen.  ER/PR/HER2 immunohistochemistry will be performed on block A1, with  reflex to [place] for HER2 2+. The results will be reported in an addendum.    GROSS  DESCRIPTION:  A. Labeled: Right breast 11:00 2 cm from nipple  Received: Formalin  Time/date in fixative: Collected and placed in formalin at 10:10 AM on  06/22/2020  Cold ischemic time: Less than 1 minute  Total fixation time: 7.25 hours  Core pieces: 4 cores and multiple additional fragments  Size: Range from 1.1-1.4 cm in length and range from 0.3-0.4 cm in  diameter  Description: Received are cores and fragments of yellow fibrofatty  tissue with scattered areas of hemorrhage. The additional fragments are  0.5 x 0.3 x 0.2 cm in aggregate.  Ink color: Green  Entirely submitted in cassettes 1-2 with 2 cores in cassette 1 and 2  cores with the remaining fragments in cassette 2.   RB 06/22/2020    Final Diagnosis performed by Betsy Pries, MD.  Electronically signed  06/23/2020 9:45:44AM  The electronic signature indicates that the named Attending Pathologist  has evaluated the specimen  Technical component performed at [facility], [address], [place],  [place] [address] Lab: [phone] Dir: Rush Farmer, MD, MMM  Professional component performed at [facility], [facility], [place], [place], [place] [address] Lab: [phone]  Dir: Dellia Nims. Rubinas, MD   ADDENDUM:  CASE SUMMARY: BREAST BIOMARKER TESTS  TEST(S) PERFORMED:  Estrogen Receptor (ER) Status: POSITIVE      Percentage of cells with nuclear positivity: Greater than 90%      Average intensity of staining: Strong   Progesterone Receptor (PgR) Status: POSITIVE      Percentage of cells with nuclear positivity: Greater than 90%      Average intensity of staining: Strong   HER2 (by immunohistochemistry): NEGATIVE (Score 0)   SURGICAL PATHOLOGY  CASE: [phone]  PATIENT: Annette Le  Surgical Pathology Report      Specimen Submitted:  A. Breast, right   Clinical History: 50 YO F with newly DX'ed right breast cancer.  Biopsied additional 1 cm upper retro areolar  right breast mass. X-shaped  clip placed following ultrasound guided biopsy of RIGHT breast at 12:00,  retroareolar.     DIAGNOSIS:  A. BREAST, RIGHT AT 12:00, RETROAREOLAR; ULTRASOUND-GUIDED CORE NEEDLE  BIOPSY:  - BENIGN MAMMARY PARENCHYMA WITH FIBROCYSTIC AND APOCRINE CHANGES, WITH  ASSOCIATED USUAL DUCTAL HYPERPLASIA AND CALCIFICATIONS.  - NEGATIVE FOR ATYPICAL PROLIFERATIVE BREAST DISEASE.   Comment:  The patient's recent diagnosis of invasive mammary carcinoma with  mucinous features ([id number]) is noted. The current case is reviewed  together with the prior pathology. Malignant features are not  identified in the current specimen.   Imaging: Radiology review:   CLINICAL DATA:  Palpable painful RIGHT breast lump.  EXAM: DIGITAL DIAGNOSTIC BILATERAL MAMMOGRAM WITH TOMOSYNTHESIS AND CAD; ULTRASOUND RIGHT BREAST LIMITED  TECHNIQUE: Bilateral digital diagnostic mammography and breast tomosynthesis was performed. The images were evaluated with computer-aided detection.; Targeted ultrasound examination of the right breast was performed  COMPARISON:  None.  Baseline  ACR Breast Density Category c: The breast tissue is heterogeneously dense, which may obscure small masses.  FINDINGS: Spot compression tomosynthesis views of the site of palpable concern demonstrate an obscured oval mass subjacent to the palpable marker. There is an oval circumscribed mass in the upper retroareolar breast at anterior depth. No suspicious mass, distortion, or microcalcifications are identified to suggest presence of malignancy in the LEFT breast.  On physical exam, there is a firm mobile mass at the site of palpable concern in the RIGHT upper outer breast.  Targeted ultrasound was performed of the site of palpable concern. At 11 o'clock 2 cm from the nipple, there is an oval heterogeneous predominantly hypoechoic mass with mildly angular margins. It measures 14 x 15 x 12  mm.  Targeted ultrasound was performed of the upper retroareolar breast. There are 2 adjacent oval circumscribed hypoechoic masses. At 12 o'clock 1 cm from the nipple, one measures 6 x 2 x 4 mm while the second at 12  o'clock in the retroareolar breast measures 6 x 7 x 3 mm. These masses are probably benign and likely reflect complicated cysts versus benign fibroadenomas.  There are multiple benign cysts noted during real-time examination. Representative simple cysts are documented at 6 o'clock 1 cm from the nipple. They measure approximately 5 mm.  Targeted ultrasound was performed of the RIGHT axilla. No suspicious axillary lymph nodes are seen.  IMPRESSION: 1. There is an indeterminate 15 mm mass at the site of palpable/painful concern in the RIGHT upper outer breast. Recommend ultrasound-guided biopsy for definitive characterization. 2. With benign biopsy results, recommend follow-up mammogram and ultrasound of the RIGHT breast in 6 months to evaluate for stability of the 2 probably benign masses at 12 o'clock. This will establish 6 months of definitive stability. If malignant results, recommend aspiration with potential conversion to biopsy of these 2 masses if this will alter clinical management. 3. No suspicious RIGHT axillary adenopathy. 4. No mammographic evidence of malignancy in the LEFT breast.  RECOMMENDATION: 1. RIGHT breast ultrasound-guided biopsy x1 2. With benign biopsy results, RIGHT breast diagnostic mammogram and ultrasound in 6 months of the 2 additional probably benign masses at 12 o'clock.  I have discussed the findings and recommendations with the patient. If applicable, a reminder letter will be sent to the patient regarding the next appointment. Patient will be scheduled for biopsy at her earliest convenience by the schedulers and ordering provider will be notified.  BI-RADS CATEGORY  4: Suspicious.  Electronically Signed: By: Valentino Saxon MD On: 06/17/2020 15:59  CLINICAL DATA:  Recent diagnosis of invasive mammary carcinoma with mucinous features right upper outer quadrant. Benign ultrasound core biopsy 12 o'clock periareolar region right breast. History states BRCA positive.  LABS:  None.  EXAM: BILATERAL BREAST MRI WITH AND WITHOUT CONTRAST  TECHNIQUE: Multiplanar, multisequence MR images of both breasts were obtained prior to and following the intravenous administration of 7 ml of Gadavist.  Three-dimensional MR images were rendered by post-processing of the original MR data on an independent workstation. The three-dimensional MR images were interpreted, and findings are reported in the following complete MRI report for this study. Three dimensional images were evaluated at the independent interpreting workstation using the DynaCAD thin client.  COMPARISON:  Previous exam(s).  FINDINGS: Breast composition: c. Heterogeneous fibroglandular tissue.  Background parenchymal enhancement: Moderate.  Right breast: Evidence of known 1.5 cm biopsy-proven malignancy over the middle third of the upper outer quadrant with central clip artifact. No additional suspicious masses or abnormal enhancement. Few small cysts are present.  Left breast: No mass or abnormal enhancement. Few small cysts are present.  Lymph nodes: No abnormal appearing lymph nodes.  Ancillary findings:  None.  IMPRESSION: Known biopsy-proven malignancy over the upper outer quadrant of the right breast. No additional suspicious masses or abnormal enhancement within either breast. Recommend continued management per clinical treatment plan.  RECOMMENDATION: Recommend continued management per clinical treatment plan.  BI-RADS CATEGORY  6: Known biopsy-proven malignancy.   Electronically Signed   By: Marin Olp M.D.   On: 07/08/2020 11:22   Assessment    RIGHT BREAST, 11:00 2CMFN; - INVASIVE MAMMARY  CARCINOMA WITH MUCINOUS FEATURES.  ER/PR positive, HER2 negative.  Estimated 15 mm in size based on ultrasound, clinically negative axilla, and ultrasound negative axilla.  Patient Active Problem List   Diagnosis Date Noted  . Family history of pancreatic cancer   . Family history of colon cancer   . Family history of stomach cancer   .  Family history of breast cancer   . Goals of care, counseling/discussion 06/28/2020  . Invasive carcinoma of breast ([place]) 06/28/2020  . Family history of cancer 06/28/2020  . Anemia 06/28/2020  . Encounter to establish care with new doctor 02/10/2020    Plan    I reviewed again the available options with the patient. The risk of recurrence is similar between mastectomy and lumpectomy with radiation.  I also discussed that given the small size of the cancer would recommend localization lumpectomy with radiation to follow.  I also discussed that we would need to do a sentinel lymph node biopsy to check the nodes.   Explained to the patient that after her surgical treatment additional treatment will depend on her prognostic indicators and stage.   We reviewed options and if she would prefer mastectomy, plastic surgery consultation would be feasible and reconstruction a possibility.  I believe she and her husband have thoughtfully considered both options.  I discussed risks of bleeding, infection, damage to surrounding tissues, having positive margins, needing further resection, damage to nerves causing arm numbness or difficulty raising arm, causing lymphedema in the arm; as well as anesthesia risks of MI, stroke, prolonged ventilation, pulmonary embolism, thrombosis and even death.   Patient was given the opportunity to ask questions and have them answered.  They would like to proceed with right breast RFID localized lumpectomy with sentinel lymph node biopsy.   These notes generated with voice recognition software. I apologize for typographical errors.  Ronny Bacon M.D., FACS 07/12/2020, 3:18 PM

## 2020-07-13 ENCOUNTER — Telehealth: Payer: Self-pay | Admitting: Surgery

## 2020-07-13 ENCOUNTER — Encounter: Payer: Self-pay | Admitting: Licensed Clinical Social Worker

## 2020-07-13 ENCOUNTER — Ambulatory Visit: Payer: Self-pay | Admitting: Licensed Clinical Social Worker

## 2020-07-13 DIAGNOSIS — Z800 Family history of malignant neoplasm of digestive organs: Secondary | ICD-10-CM

## 2020-07-13 DIAGNOSIS — Z803 Family history of malignant neoplasm of breast: Secondary | ICD-10-CM

## 2020-07-13 DIAGNOSIS — Z1379 Encounter for other screening for genetic and chromosomal anomalies: Secondary | ICD-10-CM

## 2020-07-13 DIAGNOSIS — C50919 Malignant neoplasm of unspecified site of unspecified female breast: Secondary | ICD-10-CM

## 2020-07-13 NOTE — Progress Notes (Signed)
HPI:  Annette Le was previously seen in the [facility] due to a personal and family history of cancer and concerns regarding a hereditary predisposition to cancer. Please refer to our prior cancer genetics clinic note for more information regarding our discussion, assessment and recommendations, at the time. Annette Le recent genetic test results were disclosed to her, as were recommendations warranted by these results. These results and recommendations are discussed in more detail below.  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 No pathogenic variants identified on the [facility] Breast Cancer STAT Panel + Multi-Cancer Panel+RNA. VUS in POLE called c.712A>T and in TERT called c.2215G>A identified. The report date is 07/06/2020.  The STAT Breast cancer panel offered by [facility] includes sequencing and rearrangement analysis for the following 9 genes:  ATM, BRCA1, BRCA2, CDH1, CHEK2, PALB2, PTEN, STK11 and TP53.    The Multi-Cancer Panel + RNA offered by [facility] includes sequencing and/or deletion duplication testing of the following 84 genes: AIP, ALK, APC, ATM, AXIN2,BAP1,  BARD1, BLM, BMPR1A, BRCA1, BRCA2, BRIP1, CASR, CDC73, CDH1, CDK4, CDKN1B, CDKN1C, CDKN2A (p14ARF), CDKN2A (p16INK4a), CEBPA, CHEK2, CTNNA1, DICER1, DIS3L2, EGFR (c.2369C>T, p.Thr790Met variant only), EPCAM (Deletion/duplication testing only), FH, FLCN, GATA2, GPC3, GREM1 (Promoter region deletion/duplication testing only), HOXB13 (c.251G>A, p.Gly84Glu), HRAS, KIT, MAX, MEN1, MET, MITF (c.952G>A, p.Glu318Lys variant only), MLH1, MSH2, MSH3, MSH6, MUTYH, NBN, NF1, NF2, NTHL1, PALB2, PDGFRA, PHOX2B, PMS2, POLD1, POLE, POT1, PRKAR1A, PTCH1, PTEN, RAD50, RAD51C, RAD51D, RB1, RECQL4, RET, RUNX1, SDHAF2, SDHA (sequence changes only), SDHB, SDHC, SDHD, SMAD4, SMARCA4, SMARCB1,  SMARCE1, STK11, SUFU, TERC, TERT, TMEM127, TP53, TSC1, TSC2, VHL, WRN and WT1.*     FAMILY HISTORY:  We obtained a detailed, 4-generation family history.  Significant diagnoses are listed below: Family History  Problem Relation Age of Onset  . Colon cancer Maternal Aunt   . Lung cancer Maternal Aunt   . Stomach cancer Maternal Aunt   . Cancer Father 60       intestinal vs. stomach  . Pancreatic cancer Maternal Grandfather        d. 34s  . Breast cancer Cousin    Annette Le has 1 daughter, 78. She has 3 brothers, 1 sister, and 2 paternal half brothers, 1 paternal half sister. None have had cancer.   Annette Le mother is living at 11, no cancer history. Patient had 5 maternal aunts, 4 maternal uncles. One aunt had stomach cancer over age 9, another aunt had lung cancer and died at 28, another had colon and died at 71. No known cancers in first cousins. Maternal grandmother passed at 59. Grandfather had pancreatic cancer and died in his 18s. His brother's daughter had breast cancer in her 34s.   Annette Le father died at 66, he had either intestinal or stomach cancer. No siblings for him. Paternal grandmother died in her 59s, patient has no info about paternal grandfather.   Annette Le is is no of previous family history of genetic testing for hereditary cancer risks.  There is no reported Ashkenazi Jewish ancestry. There is no known consanguinity.     GENETIC TEST RESULTS: Genetic testing reported out on 07/06/2020 through the [facility] Breast Cancer STAT Panel + Multi-Cancer +RNA cancer panel found no pathogenic mutations.   The STAT Breast cancer panel offered by [facility] includes sequencing and rearrangement analysis for the following 9 genes:  ATM, BRCA1, BRCA2, CDH1, CHEK2, PALB2, PTEN, STK11 and TP53.    The Multi-Cancer Panel + RNA offered by [facility] includes sequencing and/or deletion duplication testing of the following 84 genes: AIP, ALK, APC, ATM, AXIN2,BAP1,  BARD1, BLM,  BMPR1A, BRCA1, BRCA2, BRIP1, CASR, CDC73, CDH1, CDK4, CDKN1B, CDKN1C, CDKN2A (p14ARF), CDKN2A (p16INK4a), CEBPA, CHEK2, CTNNA1, DICER1, DIS3L2, EGFR (c.2369C>T, p.Thr790Met variant only), EPCAM (Deletion/duplication testing only), FH, FLCN, GATA2, GPC3, GREM1 (Promoter region deletion/duplication testing only), HOXB13 (c.251G>A, p.Gly84Glu), HRAS, KIT, MAX, MEN1, MET, MITF (c.952G>A, p.Glu318Lys variant only), MLH1, MSH2, MSH3, MSH6, MUTYH, NBN, NF1, NF2, NTHL1, PALB2, PDGFRA, PHOX2B, PMS2, POLD1, POLE, POT1, PRKAR1A, PTCH1, PTEN, RAD50, RAD51C, RAD51D, RB1, RECQL4, RET, RUNX1, SDHAF2, SDHA (sequence changes only), SDHB, SDHC, SDHD, SMAD4, SMARCA4, SMARCB1, SMARCE1, STK11, SUFU, TERC, TERT, TMEM127, TP53, TSC1, TSC2, VHL, WRN and WT1.   The test report has been scanned into EPIC and is located under the Molecular Pathology section of the Results Review tab.  A portion of the result report is included below for reference.    We discussed with Annette Le that because current genetic testing is not perfect, it is possible there may be a gene mutation in one of these genes that current testing cannot detect, but that chance is small.  We also discussed, that there could be another gene that has not yet been discovered, or that we have not yet tested, that is responsible for the cancer diagnoses in the family. It is also possible there is a hereditary cause for the cancer in the family that Annette Le did not inherit and therefore was not identified in her testing.  Therefore, it is important to remain in touch with cancer genetics in the future so that we can continue to offer Annette Le the most up to date genetic testing.   Genetic testing did identify 2 Variants of uncertain significance (VUS) - one in the POLE gene called c.712A>T, a second in the TERT gene called c.2215G>A.  At this time, it is unknown if these variants are associated with increased cancer risk or if they are normal findings, but most variants  such as these get reclassified to being inconsequential. They should not be used to make medical management decisions. With time, we suspect the lab will determine the significance of these variants, if any. If we do learn more about them, we will try to contact Annette Le to discuss it further. However, it is important to stay in touch with [place] periodically and keep the address and phone number up to date.  ADDITIONAL GENETIC TESTING: We discussed with Annette Le that her genetic testing was fairly extensive.  If there are genes identified to increase cancer risk that can be analyzed in the future, we would be happy to discuss and coordinate this testing at that time.    CANCER SCREENING RECOMMENDATIONS: Annette Le test result is considered negative (normal).  This means that we have not identified a hereditary cause for her  personal and family history of cancer at this time. Most cancers happen by chance and this negative test suggests that her cancer may fall into this category.    While reassuring, this does not definitively rule out a hereditary predisposition to cancer. It is still possible that there could be genetic mutations that are undetectable by current technology. There could be genetic mutations in genes that have not been tested or identified to increase cancer risk.  Therefore, it is recommended she continue to follow the cancer management and  screening guidelines provided by her oncology and primary healthcare provider.   An individual's cancer risk and medical management are not determined by genetic test results alone. Overall cancer risk assessment incorporates additional factors, including personal medical history, family history, and any available genetic information that may result in a personalized plan for cancer prevention and surveillance.  RECOMMENDATIONS FOR FAMILY MEMBERS:  Relatives in this family might be at some increased risk of developing cancer, over the general  population risk, simply due to the family history of cancer.  We recommended female relatives in this family have a yearly mammogram beginning at age 58, or 39 years younger than the earliest onset of cancer, an annual clinical breast exam, and perform monthly breast self-exams. Female relatives in this family should also have a gynecological exam as recommended by their primary provider.  All family members should be referred for colonoscopy starting at age 42.    It is also possible there is a hereditary cause for the cancer in Annette Le family that she did not inherit and therefore was not identified in her.  Based on Annette Le family history, we recommended maternal relatives have genetic counseling and testing. Annette Le will let us know if we can be of any assistance in coordinating genetic counseling and/or testing for these family members.  FOLLOW-UP: Lastly, we discussed with Annette Le that cancer genetics is a rapidly advancing field and it is possible that new genetic tests will be appropriate for her and/or her family members in the future. We encouraged her to remain in contact with cancer genetics on an annual basis so we can update her personal and family histories and let her know of advances in cancer genetics that may benefit this family.   Our contact number was provided. Annette Le questions were answered to her satisfaction, and she knows she is welcome to call us at anytime with additional questions or concerns.   Faith Rogue, MS, [facility] Genetic Counselor [email or web address] Phone: [phone]

## 2020-07-13 NOTE — Telephone Encounter (Signed)
Revealed negative genetic testing.  Revealed that a VUS in POLE and TERT was identified. This normal result is reassuring and indicates that it is unlikely Annette Le's cancer is due to a hereditary cause.  It is unlikely that there is an increased risk of another cancer due to a mutation in one of these genes.  However, genetic testing is not perfect, and cannot definitively rule out a hereditary cause.  It will be important for her to keep in contact with genetics to learn if any additional testing may be needed in the future.

## 2020-07-13 NOTE — Telephone Encounter (Signed)
Patient has been advised of Pre-Admission date/time, COVID Testing date and Surgery date.  Surgery Date: 07/25/20, patient to arrive @ 11:30 am.     Preadmission Testing Date: 07/19/20 (phone 8a-1p) Covid Testing Date: Not needed.     Patient has been made aware to arrive on 07/25/20 @ 11:30 am as she will be having SLN bx done prior to surgery same day with Dr. Christian Mate.    Patient also reminded of her RF tag to be placed on 07/15/20. Patient verbalized understanding.

## 2020-07-14 ENCOUNTER — Telehealth: Payer: Self-pay | Admitting: Oncology

## 2020-07-14 NOTE — Telephone Encounter (Signed)
Spoke with patient to schedule her post op appointment with Dr. Tasia Catchings. Patient is agreeable to come in 6/15 at 11am

## 2020-07-14 NOTE — Telephone Encounter (Signed)
Ok to schedule any day the week of 6/13

## 2020-07-14 NOTE — Telephone Encounter (Addendum)
Pt scheduled for breast lumpectomy on 07/25/20. Please schedule patient for MD follow up approx 1 weeks after surgery (per MD progress note on 5/10) and notify pt of appt. Thanks

## 2020-07-15 ENCOUNTER — Ambulatory Visit
Admission: RE | Admit: 2020-07-15 | Discharge: 2020-07-15 | Disposition: A | Discharge status: Home / Self Care | Payer: 59 | Source: Ambulatory Visit | Attending: Surgery | Admitting: Surgery

## 2020-07-15 ENCOUNTER — Other Ambulatory Visit: Payer: Self-pay

## 2020-07-15 DIAGNOSIS — N6310 Unspecified lump in the right breast, unspecified quadrant: Secondary | ICD-10-CM | POA: Insufficient documentation

## 2020-07-15 DIAGNOSIS — R928 Other abnormal and inconclusive findings on diagnostic imaging of breast: Secondary | ICD-10-CM | POA: Insufficient documentation

## 2020-07-15 HISTORY — PX: BREAST LUMPECTOMY: SHX2

## 2020-07-19 ENCOUNTER — Other Ambulatory Visit: Payer: Self-pay

## 2020-07-19 ENCOUNTER — Encounter
Admission: RE | Admit: 2020-07-19 | Discharge: 2020-07-19 | Disposition: A | Discharge status: Home / Self Care | Payer: 59 | Source: Ambulatory Visit | Attending: Surgery | Admitting: Surgery

## 2020-07-19 NOTE — Patient Instructions (Signed)
Your procedure is scheduled on: 07/25/20 Report to The Admitting Desk at 11:30 in the  [facility]  as previously instructed .  [phone]  Same Day Surgery  Remember: Instructions that are not followed completely may result in serious medical risk, up to and including death, or upon the discretion of your surgeon and anesthesiologist your surgery may need to be rescheduled.     _X__ 1. Do not eat food or drink any liquids after midnight the night before your procedure.                  __X__2.  On the morning of surgery brush your teeth with toothpaste and water, you                 may rinse your mouth with mouthwash if you wish.  Do not swallow any              toothpaste of mouthwash.     _X__ 3.  No Alcohol for 24 hours before or after surgery.   _X__ 4.  Do Not Smoke or use e-cigarettes For 24 Hours Prior to Your Surgery.                 Do not use any chewable tobacco products for at least 6 hours prior to                 surgery.  ____  5.  Bring all medications with you on the day of surgery if instructed.   __X__  6.  Notify your doctor if there is any change in your medical condition      (cold, fever, infections).     Do not wear jewelry, make-up, hairpins, clips or nail polish. Do not wear lotions, powders, or perfumes.  Do not shave 48 hours prior to surgery. Men may shave face and neck. Do not bring valuables to the hospital.    [facility] is not responsible for any belongings or valuables.  Contacts, dentures/partials or body piercings may not be worn into surgery. Bring a case for your contacts, glasses or hearing aids, a denture cup will be supplied. Leave your suitcase in the car. After surgery it may be brought to your room. For patients admitted to the hospital, discharge time is determined by your treatment team.   Patients discharged the day of surgery will not be allowed to drive home.   Please read over the following fact sheets that you were given:    CHG soap  __X__ Take these medicines the morning of surgery with A SIP OF WATER:    1. (You may use you Flonase nasal spray if needed)  2.   3.   4.  5.  6.  ____ Fleet Enema (as directed)   __X__ Use CHG Soap/SAGE wipes as directed  ____ Use inhalers on the day of surgery  ____ Stop metformin/Janumet/Farxiga 2 days prior to surgery    ____ Take 1/2 of usual insulin dose the night before surgery. No insulin the morning          of surgery.   ____ Stop Blood Thinners Coumadin/Plavix/Xarelto/Pleta/Pradaxa/Eliquis/Effient/Aspirin  on   Or contact your Surgeon, Cardiologist or Medical Doctor regarding  ability to stop your blood thinners  __X__ Stop Anti-inflammatories 7 days before surgery such as Advil, Ibuprofen, Motrin,  BC or Goodies Powder, Naprosyn, Naproxen, Aleve, Aspirin    __X__ Stop all herbal supplements, fish oil or vitamin E until after surgery.  Stop Vitamin E, C and Ibuprofen today 07/19/20  ____ Bring C-Pap to the hospital.

## 2020-07-25 ENCOUNTER — Encounter
Admission: RE | Admit: 2020-07-25 | Discharge: 2020-07-25 | Disposition: A | Discharge status: Home / Self Care | Payer: 59 | Source: Ambulatory Visit | Attending: Surgery | Admitting: Surgery

## 2020-07-25 ENCOUNTER — Encounter
Admission: RE | Disposition: A | Discharge status: Home / Self Care | Payer: Self-pay | Source: Home / Self Care | Attending: Surgery

## 2020-07-25 ENCOUNTER — Other Ambulatory Visit: Payer: Self-pay

## 2020-07-25 ENCOUNTER — Other Ambulatory Visit: Payer: 59

## 2020-07-25 ENCOUNTER — Encounter: Payer: Self-pay | Admitting: Surgery

## 2020-07-25 ENCOUNTER — Ambulatory Visit: Payer: 59 | Admitting: Urgent Care

## 2020-07-25 ENCOUNTER — Ambulatory Visit
Admission: RE | Admit: 2020-07-25 | Discharge: 2020-07-25 | Disposition: A | Discharge status: Home / Self Care | Payer: 59 | Attending: Surgery | Admitting: Surgery

## 2020-07-25 ENCOUNTER — Ambulatory Visit
Admission: RE | Admit: 2020-07-25 | Discharge: 2020-07-25 | Disposition: A | Discharge status: Home / Self Care | Payer: 59 | Source: Ambulatory Visit | Attending: Surgery | Admitting: Surgery

## 2020-07-25 DIAGNOSIS — Z170 Estrogen receptor positive status [ER+]: Secondary | ICD-10-CM | POA: Insufficient documentation

## 2020-07-25 DIAGNOSIS — C50411 Malignant neoplasm of upper-outer quadrant of right female breast: Secondary | ICD-10-CM | POA: Insufficient documentation

## 2020-07-25 DIAGNOSIS — Z886 Allergy status to analgesic agent status: Secondary | ICD-10-CM | POA: Insufficient documentation

## 2020-07-25 DIAGNOSIS — Z79899 Other long term (current) drug therapy: Secondary | ICD-10-CM | POA: Insufficient documentation

## 2020-07-25 DIAGNOSIS — Z800 Family history of malignant neoplasm of digestive organs: Secondary | ICD-10-CM | POA: Insufficient documentation

## 2020-07-25 DIAGNOSIS — Z87891 Personal history of nicotine dependence: Secondary | ICD-10-CM | POA: Insufficient documentation

## 2020-07-25 DIAGNOSIS — N6012 Diffuse cystic mastopathy of left breast: Secondary | ICD-10-CM | POA: Insufficient documentation

## 2020-07-25 DIAGNOSIS — N6021 Fibroadenosis of right breast: Secondary | ICD-10-CM | POA: Diagnosis not present

## 2020-07-25 DIAGNOSIS — Z853 Personal history of malignant neoplasm of breast: Secondary | ICD-10-CM

## 2020-07-25 DIAGNOSIS — R928 Other abnormal and inconclusive findings on diagnostic imaging of breast: Secondary | ICD-10-CM

## 2020-07-25 DIAGNOSIS — D0581 Other specified type of carcinoma in situ of right breast: Secondary | ICD-10-CM | POA: Diagnosis not present

## 2020-07-25 DIAGNOSIS — Z801 Family history of malignant neoplasm of trachea, bronchus and lung: Secondary | ICD-10-CM | POA: Insufficient documentation

## 2020-07-25 DIAGNOSIS — N6310 Unspecified lump in the right breast, unspecified quadrant: Secondary | ICD-10-CM

## 2020-07-25 DIAGNOSIS — Z803 Family history of malignant neoplasm of breast: Secondary | ICD-10-CM | POA: Diagnosis not present

## 2020-07-25 HISTORY — PX: BREAST LUMPECTOMY,RADIO FREQ LOCALIZER,AXILLARY SENTINEL LYMPH NODE BIOPSY: SHX6900

## 2020-07-25 LAB — POCT PREGNANCY, URINE: Preg Test, Ur: NEGATIVE

## 2020-07-25 SURGERY — BREAST LUMPECTOMY,RADIO FREQ LOCALIZER,AXILLARY SENTINEL LYMPH NODE BIOPSY
Anesthesia: General | Laterality: Right

## 2020-07-25 MED ORDER — FENTANYL CITRATE (PF) 100 MCG/2ML IJ SOLN
INTRAMUSCULAR | Status: DC | PRN
  Administered 2020-07-25 (×2): 50 ug via INTRAVENOUS

## 2020-07-25 MED ORDER — TECHNETIUM TC 99M TILMANOCEPT KIT
1.0000 | PACK | Freq: Once | INTRAVENOUS | Status: AC | PRN
  Administered 2020-07-25: 1.13 via INTRADERMAL

## 2020-07-25 MED ORDER — CHLORHEXIDINE GLUCONATE CLOTH 2 % EX PADS: 6.0000 | MEDICATED_PAD | Freq: Once | CUTANEOUS | Status: DC

## 2020-07-25 MED ORDER — ONDANSETRON HCL 4 MG/2ML IJ SOLN: 4.0000 mg | Freq: Once | INTRAMUSCULAR | Status: DC | PRN

## 2020-07-25 MED ORDER — GABAPENTIN 300 MG PO CAPS
ORAL_CAPSULE | ORAL | Status: AC
  Administered 2020-07-25: 300 mg via ORAL
  Filled 2020-07-25: qty 1

## 2020-07-25 MED ORDER — CHLORHEXIDINE GLUCONATE 0.12 % MT SOLN: 15.0000 mL | Freq: Once | OROMUCOSAL | Status: AC

## 2020-07-25 MED ORDER — BUPIVACAINE-EPINEPHRINE (PF) 0.25% -1:200000 IJ SOLN
INTRAMUSCULAR | Status: DC | PRN
  Administered 2020-07-25: 10 mL
  Administered 2020-07-25: 6 mL
  Administered 2020-07-25: 11 mL
  Administered 2020-07-25: 3 mL

## 2020-07-25 MED ORDER — BUPIVACAINE-EPINEPHRINE (PF) 0.25% -1:200000 IJ SOLN
INTRAMUSCULAR | Status: AC
  Filled 2020-07-25: qty 30

## 2020-07-25 MED ORDER — ONDANSETRON HCL 4 MG/2ML IJ SOLN
INTRAMUSCULAR | Status: DC | PRN
  Administered 2020-07-25: 4 mg via INTRAVENOUS

## 2020-07-25 MED ORDER — CHLORHEXIDINE GLUCONATE 0.12 % MT SOLN
OROMUCOSAL | Status: AC
  Administered 2020-07-25: 15 mL via OROMUCOSAL
  Filled 2020-07-25: qty 15

## 2020-07-25 MED ORDER — CHLORHEXIDINE GLUCONATE CLOTH 2 % EX PADS
6.0000 | MEDICATED_PAD | Freq: Once | CUTANEOUS | Status: DC
Start: 2020-07-25 — End: 2020-07-25

## 2020-07-25 MED ORDER — FENTANYL CITRATE (PF) 100 MCG/2ML IJ SOLN
INTRAMUSCULAR | Status: AC
  Administered 2020-07-25: 25 ug via INTRAVENOUS
  Filled 2020-07-25: qty 2

## 2020-07-25 MED ORDER — MIDAZOLAM HCL 2 MG/2ML IJ SOLN
INTRAMUSCULAR | Status: AC
  Filled 2020-07-25: qty 2

## 2020-07-25 MED ORDER — BUPIVACAINE LIPOSOME 1.3 % IJ SUSP: 20.0000 mL | Freq: Once | INTRAMUSCULAR | Status: DC

## 2020-07-25 MED ORDER — OXYCODONE HCL 5 MG PO TABS: 5.0000 mg | ORAL_TABLET | Freq: Four times a day (QID) | ORAL | 0 refills | Status: DC | PRN

## 2020-07-25 MED ORDER — ORAL CARE MOUTH RINSE: 15.0000 mL | Freq: Once | OROMUCOSAL | Status: AC

## 2020-07-25 MED ORDER — DEXMEDETOMIDINE (PRECEDEX) IN NS 20 MCG/5ML (4 MCG/ML) IV SYRINGE
PREFILLED_SYRINGE | INTRAVENOUS | Status: DC | PRN
  Administered 2020-07-25 (×2): 8 ug via INTRAVENOUS

## 2020-07-25 MED ORDER — LACTATED RINGERS IV SOLN: INTRAVENOUS | Status: DC

## 2020-07-25 MED ORDER — CEFAZOLIN SODIUM-DEXTROSE 2-4 GM/100ML-% IV SOLN
INTRAVENOUS | Status: AC
  Filled 2020-07-25: qty 100

## 2020-07-25 MED ORDER — CEFAZOLIN SODIUM-DEXTROSE 2-4 GM/100ML-% IV SOLN
2.0000 g | INTRAVENOUS | Status: AC
  Administered 2020-07-25: 2 g via INTRAVENOUS

## 2020-07-25 MED ORDER — OXYCODONE HCL 5 MG PO TABS
5.0000 mg | ORAL_TABLET | Freq: Once | ORAL | Status: AC | PRN
Start: 2020-07-25 — End: 2020-07-25
  Administered 2020-07-25: 5 mg via ORAL

## 2020-07-25 MED ORDER — MIDAZOLAM HCL 2 MG/2ML IJ SOLN
INTRAMUSCULAR | Status: DC | PRN
  Administered 2020-07-25: 2 mg via INTRAVENOUS

## 2020-07-25 MED ORDER — PROPOFOL 10 MG/ML IV BOLUS
INTRAVENOUS | Status: DC | PRN
  Administered 2020-07-25: 180 mg via INTRAVENOUS

## 2020-07-25 MED ORDER — GABAPENTIN 300 MG PO CAPS
300.0000 mg | ORAL_CAPSULE | ORAL | Status: AC
Start: 2020-07-25 — End: 2020-07-25

## 2020-07-25 MED ORDER — LIDOCAINE HCL (CARDIAC) PF 100 MG/5ML IV SOSY
PREFILLED_SYRINGE | INTRAVENOUS | Status: DC | PRN
  Administered 2020-07-25: 80 mg via INTRAVENOUS

## 2020-07-25 MED ORDER — OXYCODONE HCL 5 MG/5ML PO SOLN: 5.0000 mg | Freq: Once | ORAL | Status: AC | PRN

## 2020-07-25 MED ORDER — OXYCODONE HCL 5 MG PO TABS
ORAL_TABLET | ORAL | Status: AC
  Filled 2020-07-25: qty 1

## 2020-07-25 MED ORDER — FENTANYL CITRATE (PF) 100 MCG/2ML IJ SOLN
25.0000 ug | INTRAMUSCULAR | Status: DC | PRN
  Administered 2020-07-25: 25 ug via INTRAVENOUS

## 2020-07-25 MED ORDER — DEXAMETHASONE SODIUM PHOSPHATE 10 MG/ML IJ SOLN
INTRAMUSCULAR | Status: DC | PRN
  Administered 2020-07-25: 10 mg via INTRAVENOUS

## 2020-07-25 MED ORDER — FENTANYL CITRATE (PF) 100 MCG/2ML IJ SOLN
INTRAMUSCULAR | Status: AC
  Filled 2020-07-25: qty 2

## 2020-07-25 MED ORDER — BUPIVACAINE LIPOSOME 1.3 % IJ SUSP
INTRAMUSCULAR | Status: AC
  Filled 2020-07-25: qty 20

## 2020-07-25 MED ORDER — ISOSULFAN BLUE 1 % ~~LOC~~ SOLN
SUBCUTANEOUS | Status: AC
  Filled 2020-07-25: qty 5

## 2020-07-25 SURGICAL SUPPLY — 43 items
ADH SKN CLS APL DERMABOND .7 (GAUZE/BANDAGES/DRESSINGS) ×1
APL PRP STRL LF DISP 70% ISPRP (MISCELLANEOUS) ×1
APPLIER CLIP 9.375 SM OPEN (CLIP)
APR CLP SM 9.3 20 MLT OPN (CLIP)
BLADE SURG 15 STRL LF DISP TIS (BLADE) ×1 IMPLANT
BLADE SURG 15 STRL SS (BLADE) ×2
CANISTER SUCT 1200ML W/VALVE (MISCELLANEOUS) IMPLANT
CHLORAPREP W/TINT 26 (MISCELLANEOUS) ×2 IMPLANT
CLIP APPLIE 9.375 SM OPEN (CLIP) IMPLANT
CNTNR SPEC 2.5X3XGRAD LEK (MISCELLANEOUS) ×1
CONT SPEC 4OZ STER OR WHT (MISCELLANEOUS) ×1
CONT SPEC 4OZ STRL OR WHT (MISCELLANEOUS) ×1
CONTAINER SPEC 2.5X3XGRAD LEK (MISCELLANEOUS) ×1 IMPLANT
COVER WAND RF STERILE (DRAPES) ×2 IMPLANT
DECANTER SPIKE VIAL GLASS SM (MISCELLANEOUS) ×2 IMPLANT
DERMABOND ADVANCED (GAUZE/BANDAGES/DRESSINGS) ×1
DERMABOND ADVANCED .7 DNX12 (GAUZE/BANDAGES/DRESSINGS) ×1 IMPLANT
DEVICE DUBIN SPECIMEN MAMMOGRA (MISCELLANEOUS) ×2 IMPLANT
DRAPE LAPAROTOMY TRNSV 106X77 (MISCELLANEOUS) ×2 IMPLANT
ELECT CAUTERY BLADE TIP 2.5 (TIP) ×2
ELECT REM PT RETURN 9FT ADLT (ELECTROSURGICAL) ×2
ELECTRODE CAUTERY BLDE TIP 2.5 (TIP) ×1 IMPLANT
ELECTRODE REM PT RTRN 9FT ADLT (ELECTROSURGICAL) ×1 IMPLANT
GAUZE 4X4 16PLY RFD (DISPOSABLE) ×2 IMPLANT
GLOVE SURG ORTHO LTX SZ7.5 (GLOVE) ×10 IMPLANT
GOWN STRL REUS W/ TWL LRG LVL3 (GOWN DISPOSABLE) ×3 IMPLANT
GOWN STRL REUS W/TWL LRG LVL3 (GOWN DISPOSABLE) ×6
KIT MARKER MARGIN INK (KITS) IMPLANT
KIT TURNOVER KIT A (KITS) ×2 IMPLANT
MANIFOLD NEPTUNE II (INSTRUMENTS) ×2 IMPLANT
NEEDLE HYPO 22GX1.5 SAFETY (NEEDLE) ×2 IMPLANT
PACK BASIN MINOR ARMC (MISCELLANEOUS) ×2 IMPLANT
SET LOCALIZER 20 PROBE US (MISCELLANEOUS) ×2 IMPLANT
SET WALTER ACTIVATION W/DRAPE (SET/KITS/TRAYS/PACK) IMPLANT
SLEVE PROBE SENORX GAMMA FIND (MISCELLANEOUS) ×2 IMPLANT
SUT MNCRL 4-0 (SUTURE) ×4
SUT MNCRL 4-0 27XMFL (SUTURE) ×2
SUT VIC AB 3-0 SH 27 (SUTURE) ×4
SUT VIC AB 3-0 SH 27X BRD (SUTURE) ×2 IMPLANT
SUTURE MNCRL 4-0 27XMF (SUTURE) ×2 IMPLANT
SYR 10ML LL (SYRINGE) ×2 IMPLANT
SYR 20ML LL LF (SYRINGE) ×2 IMPLANT
WATER STERILE IRR 1000ML POUR (IV SOLUTION) ×2 IMPLANT

## 2020-07-25 NOTE — Anesthesia Preprocedure Evaluation (Signed)
Anesthesia Evaluation  Patient identified by MRN, date of birth, ID band Patient awake    Reviewed: Allergy & Precautions, NPO status , Patient's Chart, lab work & pertinent test results  History of Anesthesia Complications Negative for: history of anesthetic complications  Airway Mallampati: II  TM Distance: >3 FB Neck ROM: Full    Dental no notable dental hx. (+) Teeth Intact   Pulmonary neg sleep apnea, neg COPD, Patient abstained from smoking.Not current smoker, former smoker,  Asthma listed but patient denies    Pulmonary exam normal breath sounds clear to auscultation       Cardiovascular Exercise Tolerance: Good METS(-) hypertension(-) CAD and (-) Past MI negative cardio ROS  (-) dysrhythmias  Rhythm:Regular Rate:Normal - Systolic murmurs    Neuro/Psych negative neurological ROS  negative psych ROS   GI/Hepatic neg GERD  ,(+)     (-) substance abuse  ,   Endo/Other  neg diabetes  Renal/GU negative Renal ROS     Musculoskeletal   Abdominal   Peds  Hematology   Anesthesia Other Findings Past Medical History: No date: Allergy No date: Asthma No date: Carpal tunnel syndrome No date: Family history of breast cancer No date: Family history of colon cancer No date: Family history of pancreatic cancer No date: Family history of stomach cancer No date: Knee injury  Reproductive/Obstetrics                             Anesthesia Physical Anesthesia Plan  ASA: II  Anesthesia Plan: General   Post-op Pain Management:    Induction: Intravenous  PONV Risk Score and Plan: 3 and Ondansetron, Dexamethasone and Midazolam  Airway Management Planned: LMA  Additional Equipment: None  Intra-op Plan:   Post-operative Plan: Extubation in OR  Informed Consent: I have reviewed the patients History and Physical, chart, labs and discussed the procedure including the risks, benefits and  alternatives for the proposed anesthesia with the patient or authorized representative who has indicated his/her understanding and acceptance.     Dental advisory given  Plan Discussed with: CRNA and Surgeon  Anesthesia Plan Comments: (Discussed risks of anesthesia with patient, including PONV, sore throat, lip/dental damage. Rare risks discussed as well, such as cardiorespiratory and neurological sequelae. Patient understands.)        Anesthesia Quick Evaluation

## 2020-07-25 NOTE — Discharge Instructions (Signed)
AMBULATORY SURGERY  DISCHARGE INSTRUCTIONS   1) The drugs that you were given will stay in your system until tomorrow so for the next 24 hours you should not:  A) Drive an automobile B) Make any legal decisions C) Drink any alcoholic beverage   2) You may resume regular meals tomorrow.  Today it is better to start with liquids and gradually work up to solid foods.  You may eat anything you prefer, but it is better to start with liquids, then soup and crackers, and gradually work up to solid foods.   3) Please notify your doctor immediately if you have any unusual bleeding, trouble breathing, redness and pain at the surgery site, drainage, fever, or pain not relieved by medication.    4) Additional Instructions:        Please contact your physician with any problems or Same Day Surgery at [phone], Monday through Friday 6 am to 4 pm, or [place] [facility] number at [phone].  Sentinel Lymph Node Biopsy in Breast Cancer Treatment Sentinel lymph node biopsy is a procedure to identify, remove, and examine one or more lymph nodes for cancer. Lymph is fluid from the tissues in the body. It is removed through the lymphatic system. This system is part of the body's defense system (immune system) and includes lymph nodes and lymph vessels. Cancer can spread to nearby lymph nodes. It usually spreads to one lymph node first, and then to others. The first lymph node that the cancer could spread to is called the sentinel lymph node. In some cases, there may be more than one sentinel lymph node. If you have breast cancer, you may have this procedure to determine whether your cancer has spread. For breast cancer, a sentinel lymph node is usually in the armpit because that is where breast cancer tends to spread first. If no cancer is found in a sentinel lymph node, it is very unlikely that the cancer has spread to any of the other lymph nodes. If cancer is found in a sentinel lymph  node, your surgeon may remove additional lymph nodes for examination. Tell a health care provider about:  Any allergies you have.  All medicines you are taking, including vitamins, herbs, eye drops, creams, and over-the-counter medicines.  Any problems you or family members have had with anesthetic medicines.  Any blood disorders you have.  Any surgeries you have had.  Any medical conditions you have.  Whether you are pregnant or may be pregnant. What are the risks? Generally, this is a safe procedure. However, problems may occur, including:  Infection.  Allergic reactions to medicines or dyes.  Staining of the skin where the dye is injected.  Damaged lymph vessels, causing a buildup of fluid (lymphedema).  Damage to nearby structures or organs.  Pain, bleeding, or bruising where a lymph node was removed (biopsy site).  A false-negative biopsy. This is when cancer cells are not found in the sentinel node, but the cancer has spread to other nodes in the area. What happens before the procedure? Staying hydrated Follow instructions from your health care provider about hydration, which may include:  Up to 2 hours before the procedure - you may continue to drink clear liquids, such as water, clear fruit juice, black coffee, and plain tea.   Eating and drinking restrictions Follow instructions from your health care provider about eating and drinking, which may include:  8 hours before the procedure - stop eating heavy meals or foods, such as  meat, fried foods, or fatty foods.  6 hours before the procedure - stop eating light meals or foods, such as toast or cereal.  6 hours before the procedure - stop drinking milk or drinks that contain milk.  2 hours before the procedure - stop drinking clear liquids. Medicines Ask your health care provider about:  Changing or stopping your regular medicines. This is especially important if you are taking diabetes medicines or blood  thinners.  Taking medicines such as aspirin and ibuprofen. These medicines can thin your blood. Do not take these medicines unless your health care provider tells you to take them.  Taking over-the-counter medicines, vitamins, herbs, and supplements. General instructions  Do not use any products that contain nicotine or tobacco for at least 4 weeks before the procedure. These products include cigarettes, e-cigarettes, and chewing tobacco. If you need help quitting, ask your health care provider.  You may have blood tests to make sure your blood clots normally.  You may have screening tests or exams to get baseline measurements of your arm. These can be compared to measurements done after surgery to monitor for swelling (lymphedema) that can develop after having lymph nodes removed.  Plan to have someone take you home from the hospital or clinic.  Ask your health care provider: ? How your surgery site will be marked. ? What steps will be taken to help prevent infection. These may include:  Washing skin with a germ-killing soap.  Taking antibiotic medicine. What happens during the procedure?  An IV will be inserted into one of your veins.  You will be given one or more of the following: ? A medicine to help you relax (sedative). ? A medicine to numb the area (local anesthetic). ? A medicine to make you fall asleep (general anesthetic).  Blue dye, a radioactive substance, or both will be injected around the tumor in your breast. Both the dye and the radioactive substance will follow the same path that a spreading cancer would be likely to follow. ? The blue dye will reach your lymph node quickly. It may be given just before surgery. ? The radioactive substance will take longer to reach your lymph nodes, so it may be given before you go into the operating area. A scanner will show where the substance has spread. This will help identify the sentinel lymph node.  The surgeon will make a  small incision. If blue dye was injected, your surgeon will look for any lymph nodes that have picked up the dye.  Sentinel lymph nodes will be removed and sent to a lab for examination. ? If no cancer is found, no other lymph nodes will be removed. This means it is unlikely that the cancer has spread to other lymph nodes. ? If cancer is found, the surgeon will remove other lymph nodes in the armpit for examination. This may happen during the same procedure or at a later time.  The incision will be closed with stitches (sutures) or metal clips.  Small adhesive bandages may be used to keep the skin edges close together.  A small dressing may be taped over the incision area. The procedure may vary among health care providers and hospitals.   What happens after the procedure?  Your blood pressure, heart rate, breathing rate, and blood oxygen level will be monitored until you leave the hospital or clinic.  For the next 24 hours, your urine may be blue, and your stool may be darker. This is normal. It  is caused by the dye that is used during the procedure.  Your skin at the injection site may be blue for up to 8 weeks.  It is up to you to get the results of your procedure. Ask your health care provider, or the department that is doing the procedure, when your results will be ready. Summary  Sentinel lymph node biopsy is a procedure to identify, remove, and examine one or more lymph nodes for cancer.  If you have breast cancer, you may have this procedure to determine whether your cancer has spread.  If cancer is found in a sentinel lymph node, your surgeon may remove additional lymph nodes for examination. This information is not intended to replace advice given to you by your health care provider. Make sure you discuss any questions you have with your health care provider. Document Revised: 09/22/2018 Document Reviewed: 09/22/2018 Elsevier Patient Education  [place].

## 2020-07-25 NOTE — Op Note (Signed)
  Pre-operative Diagnosis: Right Breast Cancer     Post-operative Diagnosis: Same  Surgeon: Ronny Bacon, M.D., FACS  Anesthesia: General LMA  Procedure: Right upper outer quadrant lumpectomy, RFID tag directed, sentinel node biopsy  Procedure Details  The patient was seen again in the Holding Room. The benefits, complications, treatment options, and expected outcomes were discussed with the patient. The risks of bleeding, infection, recurrence of symptoms, failure to resolve symptoms, hematoma, seroma, open wound, cosmetic deformity, and the need for further surgery were discussed.  The patient was taken to Operating Room, identified as Annette Le and the procedure verified.  A Time Out was held and the above information confirmed.  Prior to the induction of general anesthesia, antibiotic prophylaxis was administered. VTE prophylaxis was in place. The patient was positioned in the supine position. Appropriate anesthesia was then administered and tolerated well. The LOCALizer is used to mark the skin for incision.  The chest was prepped with Chloraprep and draped in the sterile fashion.  Then using the hand-held probe an area of high counts was identified in the axilla, an incision was made and direction by the probe aided in dissection of 3 lymph nodes which were sent for permanent section.  Background counts were well below the 10% threshold of the in vivo sentinel node counts.  Attention was turned to the RFID tag localization site where an incision was made. Dissection using the LOCALizer to perform a lumpectomy with adequate margins was performed. This was done with electrocautery and sharp dissection with Mayo scissors. There was minimal bleeding, and the cavity packed.  The specimen was taken to the back table and painted to demarcate the 6 surfaces of potential margin.   I returned to the cavity to remove the packing, and hemostasis was confirmed with electrocautery.   Upon  reviewing the Faxitron imaging, I felt that the superior margin was potentially at risk, so I returned and sharply excised an additional superior margin.  Once assuring that hemostasis was adequate and checked multiple times the wound was closed with interrupted 3-0 Vicryl followed by 4-0 subcuticular Monocryl sutures.  The axillary wound was closed in a similar fashion. Dermabond is utilized to seal the incision.  Local depot anesthetic of quarter percent Marcaine utilized to the initial incisions and to the breast cavity upon completion.   Findings: Faxitron imaging: Markers well within the specimen, most approximate to the superior margin.  Estimated Blood Loss: Minimal         Drains: None         Specimens: Sentinel lymph nodes, x3.  Upper outer quadrant right breast tissue.       Complications: None         Condition: Stable  Sentinel Node Biopsy Synoptic Operative Report  Operation performed with curative intent:Yes  Tracer(s) used to identify sentinel nodes in the upfront surgery (non-neoadjuvant) setting (select all that apply):Radioactive Tracer  Tracer(s) used to identify sentinel nodes in the neoadjuvant setting (select all that apply):N/A  All nodes (colored or non-colored) present at the end of a dye-filled lymphatic channel were removed:N/A  All significantly radioactive nodes were removed:Yes  All palpable suspicious nodes were removed:Yes  Biopsy-proven positive nodes marked with clips prior to chemotherapy were identified and removed:N/A    Ronny Bacon, M.D., [facility]  07/25/2020 ; 4:05 PM

## 2020-07-25 NOTE — Transfer of Care (Signed)
Immediate Anesthesia Transfer of Care Note  Patient: Annette Le  Procedure(s) Performed: BREAST [place] SENTINEL LYMPH NODE BIOPSY (Right )  Patient Location: PACU  Anesthesia Type:General  Level of Consciousness: drowsy and patient cooperative  Airway & Oxygen Therapy: Patient Spontanous Breathing and Patient connected to face mask oxygen  Post-op Assessment: Report given to RN and Post -op Vital signs reviewed and stable  Post vital signs: Reviewed and stable  Last Vitals:  Vitals Value Taken Time  BP 93/63 07/25/20 1615  Temp 36.4 C 07/25/20 1615  Pulse 79 07/25/20 1617  Resp 14 07/25/20 1617  SpO2 100 % 07/25/20 1617  Vitals shown include unvalidated device data.  Last Pain:  Vitals:   07/25/20 1238  TempSrc: Temporal  PainSc: 3          Complications: No complications documented.

## 2020-07-25 NOTE — Interval H&P Note (Signed)
History and Physical Interval Note:  07/25/2020 1:41 PM  Annette Le  has presented today for surgery, with the diagnosis of right breast cancer.  The various methods of treatment have been discussed with the patient and family. After consideration of risks, benefits and other options for treatment, the patient has consented to  Procedure(s): [place] (Right) as a surgical intervention.  The patient's history has been reviewed, patient examined, no change in status, stable for surgery.  I have reviewed the patient's chart and labs.  Questions were answered to the patient's satisfaction.   Right side is marked.   Ronny Bacon  Ronny Bacon, M.D., [facility]  07/25/2020 ; 1:41 PM

## 2020-07-25 NOTE — Anesthesia Postprocedure Evaluation (Signed)
Anesthesia Post Note  Patient: Annette Le  Procedure(s) Performed: BREAST LUMPECTOMY,RADIO FREQ LOCALIZER,AXILLARY SENTINEL LYMPH NODE BIOPSY (Right )  Patient location during evaluation: PACU Anesthesia Type: General Level of consciousness: awake and alert Pain management: pain level controlled Vital Signs Assessment: post-procedure vital signs reviewed and stable Respiratory status: spontaneous breathing, nonlabored ventilation, respiratory function stable and patient connected to nasal cannula oxygen Cardiovascular status: blood pressure returned to baseline and stable Postop Assessment: no apparent nausea or vomiting Anesthetic complications: no   No complications documented.   Last Vitals:  Vitals:   07/25/20 1700 07/25/20 1712  BP: 119/83 116/80  Pulse: 80 76  Resp: 13 16  Temp:  36.4 C  SpO2: 100% 100%    Last Pain:  Vitals:   07/25/20 1712  TempSrc: Temporal  PainSc:                  [name]

## 2020-07-25 NOTE — Anesthesia Procedure Notes (Signed)
Procedure Name: LMA Insertion Date/Time: 07/25/2020 2:21 PM Performed by: Biagio Borg, CRNA Pre-anesthesia Checklist: Patient identified, Emergency Drugs available, Suction available and Patient being monitored Patient Re-evaluated:Patient Re-evaluated prior to induction Oxygen Delivery Method: Circle system utilized Preoxygenation: Pre-oxygenation with 100% oxygen Induction Type: IV induction Ventilation: Mask ventilation without difficulty LMA: LMA inserted LMA Size: 3.5 Tube type: Oral Number of attempts: 1 Airway Equipment and Method: Stylet and Oral airway Placement Confirmation: ETT inserted through vocal cords under direct vision,  positive ETCO2 and breath sounds checked- equal and bilateral Tube secured with: Tape Dental Injury: Teeth and Oropharynx as per pre-operative assessment

## 2020-07-26 ENCOUNTER — Encounter: Payer: Self-pay | Admitting: Surgery

## 2020-07-28 ENCOUNTER — Other Ambulatory Visit: Payer: Self-pay | Admitting: Anatomic Pathology & Clinical Pathology

## 2020-07-28 LAB — SURGICAL PATHOLOGY

## 2020-08-01 ENCOUNTER — Telehealth: Payer: Self-pay

## 2020-08-01 NOTE — Telephone Encounter (Signed)
Oncotype order submitted (order #[id number]) on specimen [phone]

## 2020-08-03 ENCOUNTER — Inpatient Hospital Stay: Payer: 59 | Attending: Oncology | Admitting: Oncology

## 2020-08-03 ENCOUNTER — Encounter: Payer: Self-pay | Admitting: Oncology

## 2020-08-03 VITALS — BP 115/81 | Temp 88.0°F | Resp 18 | Wt 156.7 lb

## 2020-08-03 DIAGNOSIS — Z809 Family history of malignant neoplasm, unspecified: Secondary | ICD-10-CM | POA: Diagnosis not present

## 2020-08-03 DIAGNOSIS — C50919 Malignant neoplasm of unspecified site of unspecified female breast: Secondary | ICD-10-CM

## 2020-08-03 DIAGNOSIS — Z170 Estrogen receptor positive status [ER+]: Secondary | ICD-10-CM | POA: Insufficient documentation

## 2020-08-03 DIAGNOSIS — D509 Iron deficiency anemia, unspecified: Secondary | ICD-10-CM | POA: Insufficient documentation

## 2020-08-03 DIAGNOSIS — C50411 Malignant neoplasm of upper-outer quadrant of right female breast: Secondary | ICD-10-CM | POA: Insufficient documentation

## 2020-08-03 DIAGNOSIS — Z923 Personal history of irradiation: Secondary | ICD-10-CM

## 2020-08-03 HISTORY — DX: Personal history of irradiation: Z92.3

## 2020-08-03 NOTE — Progress Notes (Signed)
Patient here for follow up post right breast surgery.

## 2020-08-03 NOTE — Progress Notes (Signed)
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  female with PMH listed below was seen in consultation at the request of  Nobie Putnam *  for evaluation of breast cancer.   She felt a painful right breast nodule for about 6 weeks. Went to see primary care physician and mammogram was obtained.  06/17/2020 bilateral diagnostic mammogram showed 14 x 15 x 12 mm right upper outer breast mass, 11 o'clock 2cm from nipple. close to the site of palpable/painful concern. And 2 other probably benign mass at 12 o'clock.   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  Grade 2, DCIS not identified.  LVI not identified.  Patient is scheduled to have additional ultrasound-guided biopsy of the 2 masses at 12 o'clock position of the right breast. Patient presents to establish care discussed.  She has appointment scheduled to see surgery with Dr.Rodenberg.  She was accompanied by her husband.  Family history of breast cancer: Maternal aunt was diagnosed with breast cancer at the age of 69 Family history of other cancers: Maternal aunt stomach cancer, maternal grandfather pancreatic cancer, maternal aunt lung cancer-also a smoker.  Menarche: 50 years of age  premenopausal, LMP 06/22/2020 Number of pregnancies :  Age at first live childbirth: Used OCP: Remote use for a few months. Used estrogen and progesterone therapy: Denies History of Radiation to the chest: Denies Previous of breast biopsy: Denies   INTERVAL HISTORY Annette Le is a 50 y.o. female who has above  history reviewed by me today presents for follow up visit for management of right breast cancer Problems and complaints are listed below:  06/29/2020, right breast 12:00 retroareolar ultrasound-guided biopsy showed negative for cancer 07/08/2020, MRI breast bilateral with and without contrast showed known biopsy-proven malignancy over the upper outer quadrant of the right breast.  No additional suspicious masses or abdominal enhancement within either breast. 07/25/2020 underwent right breast lumpectomy and sentinel lymph node biopsy Pathology showed mucinous carcinoma, 1.5 cm, grade 2, 4 sentinel lymph nodes were excised and 0 was involved with malignancy.  Margins were all negative.  pT1c pN0, Per previous biopsy, ER 90%, PR 90%, HER2 negative by immunohistochemistry Case was discussed on tumor board on 08/01/20 Patient reports feeling well.  She present to discuss results and management plan.  She has some soreness at the site of surgery.  No fever, wound discharge.  Review of Systems  Constitutional:  Negative for appetite change, chills, fatigue and fever.  HENT:   Negative for hearing loss and voice change.   Eyes:  Negative for eye problems.  Respiratory:  Negative for chest tightness and cough.   Cardiovascular:  Negative for chest pain.  Gastrointestinal:  Negative for abdominal distention, abdominal pain and blood in stool.  Endocrine: Negative for hot flashes.  Genitourinary:  Negative for difficulty urinating and frequency.   Musculoskeletal:  Positive for back pain. Negative for arthralgias.  Skin:  Negative for itching and rash.  Neurological:  Negative for extremity weakness.  Hematological:  Negative for adenopathy.  Psychiatric/Behavioral:  Negative for confusion.  right breast nodule  MEDICAL HISTORY:  Past Medical History:  Diagnosis Date  Allergy    Asthma    Carpal tunnel syndrome    Family history of breast cancer    Family history of colon cancer    Family history  of pancreatic cancer    Family history of stomach cancer    Knee injury     SURGICAL HISTORY: Past Surgical History:  Procedure Laterality Date   BREAST BIOPSY Right 06/22/2020   u/s bx @ 11:00-"vision" clip-INVASIVE MAMMARY CARCINOMA WITH MUCINOUS FEATURES.   BREAST BIOPSY Right 06/29/2020   [place] bx at 12:00, x marker, benign   BREAST LUMPECTOMY Right 07/15/2020   Rt 11:00 [place] tag placement tag [id number] surgery to follow    [place] BIOPSY Right 07/25/2020   Procedure: BREAST LUMPECTOMY,RADIO FREQ LOCALIZER,AXILLARY SENTINEL LYMPH NODE BIOPSY;  Surgeon: Ronny Bacon, MD;  Location: [facility] ORS;  Service: General;  Laterality: Right;    SOCIAL HISTORY: Social History   Socioeconomic History   Marital status: Married    Spouse name: Not on file   Number of children: Not on file   Years of education: Not on file   Highest education level: Not on file  Occupational History   Not on file  Tobacco Use   Smoking status: Former    Pack years: 0.00    Types: Cigarettes    Quit date: 10/11/2003    Years since quitting: 16.8   Smokeless tobacco: Never  Vaping Use   Vaping Use: Never used  Substance and Sexual Activity   Alcohol use: Yes    Comment: occ   Drug use: Never   Sexual activity: Not on file  Other Topics Concern   Not on file  Social History Narrative   Not on file   Social Determinants of Health   Financial Resource Strain: Not on file  Food Insecurity: Not on file  Transportation Needs: Not on file  Physical Activity: Not on file  Stress: Not on file  Social Connections: Not on file  Intimate Partner Violence: Not on file    FAMILY HISTORY: Family History  Problem Relation Age of Onset   Colon cancer Maternal Aunt    Lung cancer Maternal Aunt    Stomach cancer Maternal Aunt    Cancer Father 8       intestinal vs. stomach   Pancreatic cancer Maternal Grandfather        d. 76s   Breast cancer  Cousin     ALLERGIES:  is allergic to shrimp extract allergy skin test, ibuprofen, shrimp [shellfish allergy], tylenol [acetaminophen], and other.  MEDICATIONS:  Current Outpatient Medications  Medication Sig Dispense Refill   Ascorbic Acid (VITAMIN C) 500 MG CAPS Take 500 mg by mouth daily.     fluticasone (FLONASE) 50 MCG/ACT nasal spray Place 1 spray into both nostrils daily as needed for allergies or rhinitis.     ibuprofen (ADVIL) 600 MG tablet Take 600 mg by mouth every 6 (six) hours as needed for moderate pain.     Multiple Vitamin (MULTIVITAMIN WITH MINERALS) TABS tablet Take 1 tablet by mouth daily.     oxyCODONE (OXY IR/ROXICODONE) 5 MG immediate release tablet Take 1 tablet (5 mg total) by mouth every 6 (six) hours as needed for severe pain. 15 tablet 0   vitamin E 1000 UNIT capsule Take 1,000 Units by mouth daily.     No current facility-administered medications for this visit.     PHYSICAL EXAMINATION: ECOG PERFORMANCE STATUS: 1 - Symptomatic but completely ambulatory  Vitals:   08/03/20 1114  BP: 115/81  Resp: 18  Temp: (!) 88 F (31.1 C)   Filed Weights   08/03/20 1114  Weight: 156 lb 11.2 oz (71.1 kg)    Physical Exam  LABORATORY DATA:  I have reviewed the data as listed Lab Results  Component Value Date   WBC 6.4 06/28/2020   HGB 10.9 (L) 06/28/2020   HCT 35.0 (L) 06/28/2020   MCV 86.8 06/28/2020   PLT 326 06/28/2020   Recent Labs    06/28/20 1102  NA 136  K 4.1  CL 104  CO2 23  GLUCOSE 116*  BUN 10  CREATININE 0.62  CALCIUM 8.7*  GFRNONAA >60  PROT 6.7  ALBUMIN 3.8  AST 26  ALT 34  ALKPHOS 76  BILITOT 0.6    Iron/TIBC/Ferritin/ %Sat    Component Value Date/Time   IRON 214 (H) 06/28/2020 1102   TIBC 438 06/28/2020 1102   FERRITIN 8 (L) 06/28/2020 1102   IRONPCTSAT 49 (H) 06/28/2020 1102      RADIOGRAPHIC STUDIES: I have personally reviewed the radiological images as listed and agreed with the findings in the report. MR  BREAST BILATERAL W WO CONTRAST INC CAD  Result Date: 07/08/2020 CLINICAL DATA:  Recent diagnosis of invasive mammary carcinoma with mucinous features right upper outer quadrant. Benign ultrasound core biopsy 12 o'clock periareolar region right breast. History states BRCA positive. LABS:  None. EXAM: BILATERAL BREAST MRI WITH AND WITHOUT CONTRAST TECHNIQUE: Multiplanar, multisequence MR images of both breasts were obtained prior to and following the intravenous administration of 7 ml of Gadavist. Three-dimensional MR images were rendered by post-processing of the original MR data on an independent workstation. The three-dimensional MR images were interpreted, and findings are reported in the following complete MRI report for this study. Three dimensional images were evaluated at the independent interpreting workstation using the DynaCAD thin client. COMPARISON:  Previous exam(s). FINDINGS: Breast composition: c. Heterogeneous fibroglandular tissue. Background parenchymal enhancement: Moderate. Right breast: Evidence of known 1.5 cm biopsy-proven malignancy over the middle third of the upper outer quadrant with central clip artifact. No additional suspicious masses or abnormal enhancement. Few small cysts are present. Left breast: No mass or abnormal enhancement. Few small cysts are present. Lymph nodes: No abnormal appearing lymph nodes. Ancillary findings:  None. IMPRESSION: Known biopsy-proven malignancy over the upper outer quadrant of the right breast. No additional suspicious masses or abnormal enhancement within either breast. Recommend continued management per clinical treatment plan. RECOMMENDATION: Recommend continued management per clinical treatment plan. BI-RADS CATEGORY  6: Known biopsy-proven malignancy. Electronically Signed   By: Marin Olp M.D.   On: 07/08/2020 11:22   NM Sentinel Node Inj-No Rpt (Breast)  Result Date: 07/25/2020 Sulfur Colloid was injected by the Nuclear Medicine  Technologist for sentinel lymph node localization.   MM BREAST SURGICAL SPECIMEN  Result Date: 07/25/2020 CLINICAL DATA:  Specimen radiograph status post right breast lumpectomy. EXAM: SPECIMEN RADIOGRAPH OF THE RIGHT BREAST COMPARISON:  Previous exam(s). FINDINGS: Status post excision of the right breast. The radiofrequency tag and biopsy marker clip are present and completely intact within the specimen. IMPRESSION: Specimen radiograph of the right breast. Electronically Signed   By: Ammie Ferrier M.D.   On: 07/25/2020 16:22   MM RT RADIO FREQUENCY TAG LOC MAMMO GUIDE  Result Date: 07/15/2020 CLINICAL DATA:  Localization prior to surgery EXAM: NEEDLE LOCALIZATION OF THE RIGHT BREAST WITH MAMMO GUIDANCE COMPARISON:  Previous exams. FINDINGS: Patient presents for needle localization prior  to surgery. I met with the patient and we discussed the procedure of needle localization including benefits and alternatives. We discussed the high likelihood of a successful procedure. We discussed the risks of the procedure, including infection, bleeding, tissue injury, and further surgery. Informed, written consent was given. The usual time-out protocol was performed immediately prior to the procedure. Using mammographic guidance, sterile technique, 1% lidocaine and a 5 cm SAVI SCOUT needle, the vision shaped clip marking the site of known malignancy was localized using a superior approach. Reflector function was confirmed. IMPRESSION: Radar reflector localization of the right breast. No apparent complications. Electronically Signed   By: Dorise Bullion III M.D   On: 07/15/2020 09:13       ASSESSMENT & PLAN:  1. Invasive carcinoma of breast ([place])   2. Family history of cancer   3. Iron deficiency anemia, unspecified iron deficiency anemia type   Cancer Staging Invasive carcinoma of breast ([place]) Staging form: Breast, AJCC 8th Edition - Pathologic stage from 08/03/2020: Stage IA (pT1c, pN0, cM0, G2, ER+, PR+,  HER2-) - Signed by Earlie Server, MD on 08/03/2020  Right breast Stage 1A mucinous carcinoma. Case was discussed on tumor board on 08/01/2020, per Dr. Reuel Derby, this is a few mucinous carcinoma, favorable histology. NCCN guideline recommendation were discussed with patient.  Recommend patient establish care with radiation oncology for adjuvant radiation.  After that she will see me for starting of antiestrogen treatments.  She is premenopausal, recommend tamoxifen.  We discussed about the rationale and potential side effects today and will go over that again in details at her next visit.  Anemia, 06/28/2020, hemoglobin 10.9, ferritin 8.  Recommend patient to start oral iron supplementation ferrous sulfate 1-2 times daily. Family history of cancer, genetic testing is negative.   VUS in POLE and TERT  Orders Placed This Encounter  Procedures   Ambulatory referral to Radiation Oncology    Referral Priority:   Routine    Referral Type:   Consultation    Referral Reason:   Specialty Services Required    Requested Specialty:   Radiation Oncology    Number of Visits Requested:   1    All questions were answered. The patient knows to call the clinic with any problems questions or concerns.  cc Nobie Putnam *    Return of visit: 2 weeks after finishing radiation. Thank you for this kind referral and the opportunity to participate in the care of this patient. A copy of today's note is routed to referring provider    Earlie Server, MD, PhD Hematology Oncology [facility] Pager- [phone] 08/03/2020

## 2020-08-04 ENCOUNTER — Ambulatory Visit (INDEPENDENT_AMBULATORY_CARE_PROVIDER_SITE_OTHER): Payer: 59 | Admitting: Surgery

## 2020-08-04 ENCOUNTER — Other Ambulatory Visit: Payer: Self-pay

## 2020-08-04 ENCOUNTER — Encounter: Payer: Self-pay | Admitting: Surgery

## 2020-08-04 ENCOUNTER — Telehealth: Payer: Self-pay | Admitting: *Deleted

## 2020-08-04 VITALS — BP 105/72 | HR 75 | Temp 98.3°F | Ht 63.0 in | Wt 157.6 lb

## 2020-08-04 DIAGNOSIS — Z170 Estrogen receptor positive status [ER+]: Secondary | ICD-10-CM

## 2020-08-04 DIAGNOSIS — C50411 Malignant neoplasm of upper-outer quadrant of right female breast: Secondary | ICD-10-CM

## 2020-08-04 NOTE — Patient Instructions (Addendum)
Please call with any questions or concerns.Please follow up with Radiation/Oncology.  We will contact you in 6 month to schedule an follow up mammogram and breast exam.

## 2020-08-04 NOTE — Telephone Encounter (Signed)
Message from [employer] requesting a return call to clarify patient name as they have her listed 2 ways [phone] ext [phone]

## 2020-08-04 NOTE — Progress Notes (Signed)
Annette Le returns today in follow-up.  She reports remarkable improvement in the discomfort previously noted in her right axilla.  She continues to be cautious around her Dermabond which is still intact. She saw Dr. Tasia Catchings yesterday and understands the treatment game plan. On examination today her ecchymosis has resolved.  Her breast and right axilla appear to be significantly diminished and edema.  It appears she is much less tender and much more comfortable with her recovery. There is no further ecchymosis no erythema and no tenderness appreciable today.  She has scheduled follow-up with Dr. Baruch Gouty next week, anticipating her to have XRT.  We discussed follow-up with me would likely be in about 6 months with a follow-up right breast diagnostic mammogram.  I believe overall she and her husband are content and appreciate their current place in her care.  And we anticipate long-term follow-up.

## 2020-08-04 NOTE — Telephone Encounter (Signed)
Faxed FMLA to [employer] at [phone] 

## 2020-08-05 NOTE — Telephone Encounter (Signed)
Spoke to [employer] with [employer] and provided her with patient name as written on chart: Annette Le.

## 2020-08-08 ENCOUNTER — Telehealth: Payer: Self-pay

## 2020-08-08 NOTE — Telephone Encounter (Signed)
FMLA forms completed for patient. Patient notified that  forms will left at [employer] for her to pick up, she did not want them faxed to [place].

## 2020-08-10 ENCOUNTER — Encounter: Payer: Self-pay | Admitting: Radiation Oncology

## 2020-08-10 ENCOUNTER — Ambulatory Visit
Admission: RE | Admit: 2020-08-10 | Discharge: 2020-08-10 | Disposition: A | Discharge status: Home / Self Care | Payer: 59 | Source: Ambulatory Visit | Attending: Radiation Oncology | Admitting: Radiation Oncology

## 2020-08-10 ENCOUNTER — Ambulatory Visit (INDEPENDENT_AMBULATORY_CARE_PROVIDER_SITE_OTHER): Payer: 59 | Admitting: Internal Medicine

## 2020-08-10 ENCOUNTER — Other Ambulatory Visit: Payer: Self-pay

## 2020-08-10 ENCOUNTER — Encounter: Payer: Self-pay | Admitting: Internal Medicine

## 2020-08-10 ENCOUNTER — Encounter: Payer: Self-pay | Admitting: Oncology

## 2020-08-10 VITALS — BP 107/73 | HR 81 | Temp 97.2°F | Resp 16 | Wt 157.6 lb

## 2020-08-10 VITALS — BP 101/61 | HR 79 | Temp 97.5°F | Resp 17 | Ht 63.0 in | Wt 156.6 lb

## 2020-08-10 DIAGNOSIS — E663 Overweight: Secondary | ICD-10-CM | POA: Diagnosis not present

## 2020-08-10 DIAGNOSIS — C50919 Malignant neoplasm of unspecified site of unspecified female breast: Secondary | ICD-10-CM | POA: Diagnosis not present

## 2020-08-10 DIAGNOSIS — Z0001 Encounter for general adult medical examination with abnormal findings: Secondary | ICD-10-CM | POA: Diagnosis not present

## 2020-08-10 DIAGNOSIS — Z170 Estrogen receptor positive status [ER+]: Secondary | ICD-10-CM

## 2020-08-10 DIAGNOSIS — D508 Other iron deficiency anemias: Secondary | ICD-10-CM | POA: Diagnosis not present

## 2020-08-10 MED ORDER — IRON (FERROUS SULFATE) 325 (65 FE) MG PO TABS: 325.0000 mg | ORAL_TABLET | Freq: Every day | ORAL | 1 refills | Status: DC

## 2020-08-10 NOTE — Patient Instructions (Signed)
Health Maintenance, Female Adopting a healthy lifestyle and getting preventive care are important in promoting health and wellness. Ask your health care provider about: The right schedule for you to have regular tests and exams. Things you can do on your own to prevent diseases and keep yourself healthy. What should I know about diet, weight, and exercise? Eat a healthy diet  Eat a diet that includes plenty of vegetables, fruits, low-fat dairy products, and lean protein. Do not eat a lot of foods that are high in solid fats, added sugars, or sodium.  Maintain a healthy weight Body mass index (BMI) is used to identify weight problems. It estimates body fat based on height and weight. Your health care provider can help determineyour BMI and help you achieve or maintain a healthy weight. Get regular exercise Get regular exercise. This is one of the most important things you can do for your health. Most adults should: Exercise for at least 150 minutes each week. The exercise should increase your heart rate and make you sweat (moderate-intensity exercise). Do strengthening exercises at least twice a week. This is in addition to the moderate-intensity exercise. Spend less time sitting. Even light physical activity can be beneficial. Watch cholesterol and blood lipids Have your blood tested for lipids and cholesterol at 48 years of age, then havethis test every 5 years. Have your cholesterol levels checked more often if: Your lipid or cholesterol levels are high. You are older than 48 years of age. You are at high risk for heart disease. What should I know about cancer screening? Depending on your health history and family history, you may need to have cancer screening at various ages. This may include screening for: Breast cancer. Cervical cancer. Colorectal cancer. Skin cancer. Lung cancer. What should I know about heart disease, diabetes, and high blood pressure? Blood pressure and heart  disease High blood pressure causes heart disease and increases the risk of stroke. This is more likely to develop in people who have high blood pressure readings, are of African descent, or are overweight. Have your blood pressure checked: Every 3-5 years if you are 18-39 years of age. Every year if you are 40 years old or older. Diabetes Have regular diabetes screenings. This checks your fasting blood sugar level. Have the screening done: Once every three years after age 40 if you are at a normal weight and have a low risk for diabetes. More often and at a younger age if you are overweight or have a high risk for diabetes. What should I know about preventing infection? Hepatitis B If you have a higher risk for hepatitis B, you should be screened for this virus. Talk with your health care provider to find out if you are at risk forhepatitis B infection. Hepatitis C Testing is recommended for: Everyone born from 1945 through 1965. Anyone with known risk factors for hepatitis C. Sexually transmitted infections (STIs) Get screened for STIs, including gonorrhea and chlamydia, if: You are sexually active and are younger than 48 years of age. You are older than 48 years of age and your health care provider tells you that you are at risk for this type of infection. Your sexual activity has changed since you were last screened, and you are at increased risk for chlamydia or gonorrhea. Ask your health care provider if you are at risk. Ask your health care provider about whether you are at high risk for HIV. Your health care provider may recommend a prescription medicine to help   prevent HIV infection. If you choose to take medicine to prevent HIV, you should first get tested for HIV. You should then be tested every 3 months for as long as you are taking the medicine. Pregnancy If you are about to stop having your period (premenopausal) and you may become pregnant, seek counseling before you get  pregnant. Take 400 to 800 micrograms (mcg) of folic acid every day if you become pregnant. Ask for birth control (contraception) if you want to prevent pregnancy. Osteoporosis and menopause Osteoporosis is a disease in which the bones lose minerals and strength with aging. This can result in bone fractures. If you are 65 years old or older, or if you are at risk for osteoporosis and fractures, ask your health care provider if you should: Be screened for bone loss. Take a calcium or vitamin D supplement to lower your risk of fractures. Be given hormone replacement therapy (HRT) to treat symptoms of menopause. Follow these instructions at home: Lifestyle Do not use any products that contain nicotine or tobacco, such as cigarettes, e-cigarettes, and chewing tobacco. If you need help quitting, ask your health care provider. Do not use street drugs. Do not share needles. Ask your health care provider for help if you need support or information about quitting drugs. Alcohol use Do not drink alcohol if: Your health care provider tells you not to drink. You are pregnant, may be pregnant, or are planning to become pregnant. If you drink alcohol: Limit how much you use to 0-1 drink a day. Limit intake if you are breastfeeding. Be aware of how much alcohol is in your drink. In the [place] one drink equals one 12 oz bottle of beer (355 mL), one 5 oz glass of wine (148 mL), or one 1 oz glass of hard liquor (44 mL). General instructions Schedule regular health, dental, and eye exams. Stay current with your vaccines. Tell your health care provider if: You often feel depressed. You have ever been abused or do not feel safe at home. Summary Adopting a healthy lifestyle and getting preventive care are important in promoting health and wellness. Follow your health care provider's instructions about healthy diet, exercising, and getting tested or screened for diseases. Follow your health care provider's  instructions on monitoring your cholesterol and blood pressure. This information is not intended to replace advice given to you by your health care provider. Make sure you discuss any questions you have with your healthcare provider. Document Revised: 01/29/2018 Document Reviewed: 01/29/2018 Elsevier Patient Education  2022 [employer] Inc.  

## 2020-08-10 NOTE — Assessment & Plan Note (Signed)
Status post lumpectomy Starts radiation today She will continue to see oncology, will follow

## 2020-08-10 NOTE — Progress Notes (Signed)
Subjective:    Patient ID: Annette Le, female    DOB: Feb 06, 1971, 50 y.o.   MRN: [id number]  HPI  Pt presents to the clinic today for her annual exam. She is also due to follow up chronic conditions.  Anemia: Her last H/H was 10.9/35, 06/2020.  She is not taking any oral iron at this time.  Breast Cancer, Right: Status post lumpectomy. She starts radiation today, will not have chemo at this time.  She is following with oncology.  Flu: never Tetanus: 2013/2014? COVID: Pfizer x2 Pap smear: 04/2018 J[facility] Bryant Mammogram: 06/2020 Colon screening:  never Vision screening: annually Dentist: biannually  Diet: She does eat meat. She consume fruits and vegges. She does eat fried foods. She drinks mostly water, juice and coffee Exercise: None  Review of Systems     Past Medical History:  Diagnosis Date   Allergy    Asthma    Carpal tunnel syndrome    Family history of breast cancer    Family history of colon cancer    Family history of pancreatic cancer    Family history of stomach cancer    Knee injury     No current outpatient medications on file.   No current facility-administered medications for this visit.    Allergies  Allergen Reactions   Shrimp Extract Allergy Skin Test Hives   Ibuprofen Hives   Shrimp [Shellfish Allergy] Hives   Tylenol [Acetaminophen] Hives   Other Rash    bandaid    Family History  Problem Relation Age of Onset   Colon cancer Maternal Aunt    Lung cancer Maternal Aunt    Stomach cancer Maternal Aunt    Cancer Father 65       intestinal vs. stomach   Pancreatic cancer Maternal Grandfather        d. 42s   Breast cancer Cousin     Social History   Socioeconomic History   Marital status: Married    Spouse name: Not on file   Number of children: Not on file   Years of education: Not on file   Highest education level: Not on file  Occupational History   Not on file  Tobacco Use   Smoking status: Former    Pack years: 0.00     Types: Cigarettes    Quit date: 10/11/2003    Years since quitting: 16.8   Smokeless tobacco: Never  Vaping Use   Vaping Use: Never used  Substance and Sexual Activity   Alcohol use: Yes    Comment: occ   Drug use: Never   Sexual activity: Not on file  Other Topics Concern   Not on file  Social History Narrative   Not on file   Social Determinants of Health   Financial Resource Strain: Not on file  Food Insecurity: Not on file  Transportation Needs: Not on file  Physical Activity: Not on file  Stress: Not on file  Social Connections: Not on file  Intimate Partner Violence: Not on file     Constitutional: Denies fever, malaise, fatigue, headache or abrupt weight changes.  HEENT: Denies eye pain, eye redness, ear pain, ringing in the ears, wax buildup, runny nose, nasal congestion, bloody nose, or sore throat. Respiratory: Denies difficulty breathing, shortness of breath, cough or sputum production.   Cardiovascular: Denies chest pain, chest tightness, palpitations or swelling in the hands or feet.  Gastrointestinal: Denies abdominal pain, bloating, constipation, diarrhea or blood in the stool.  GU: Denies  urgency, frequency, pain with urination, burning sensation, blood in urine, odor or discharge. Musculoskeletal: Denies decrease in range of motion, difficulty with gait, muscle pain or joint pain and swelling.  Skin: Denies redness, rashes, lesions or ulcercations.  Neurological: Denies dizziness, difficulty with memory, difficulty with speech or problems with balance and coordination.  Psych: Denies anxiety, depression, SI/HI.  No other specific complaints in a complete review of systems (except as listed in HPI above).  Objective:   Physical Exam   BP 101/61 (BP Location: Left Arm, Patient Position: Sitting, Cuff Size: Normal)   Pulse 79   Temp (!) 97.5 F (36.4 C) (Temporal)   Resp 17   Ht 5\' 3"  (1.6 m)   Wt 156 lb 9.6 oz (71 kg)   SpO2 100%   BMI 27.74  kg/m   Wt Readings from Last 3 Encounters:  08/04/20 157 lb 9.6 oz (71.5 kg)  08/03/20 156 lb 11.2 oz (71.1 kg)  07/25/20 156 lb (70.8 kg)    General: Appears her stated age, overweight, in NAD. Skin: Warm, dry and intact. No rashes noted. HEENT: Head: normal shape and size; Eyes: sclera white and EOMs intact;  Neck:  Neck supple, trachea midline. No masses, lumps or thyromegaly present.  Cardiovascular: Normal rate and rhythm. S1,S2 noted.  No murmur, rubs or gallops noted. No JVD or BLE edema.  Pulmonary/Chest: Normal effort and positive vesicular breath sounds. No respiratory distress. No wheezes, rales or ronchi noted.  Abdomen: Soft and nontender. Normal bowel sounds. No distention or masses noted. Liver, spleen and kidneys non palpable. Musculoskeletal: Strength 5/5 BUE/BLE. No difficulty with gait.  Neurological: Alert and oriented. Cranial nerves II-XII grossly intact. Coordination normal.  Psychiatric: Mood and affect normal. Behavior is normal. Judgment and thought content normal.     BMET    Component Value Date/Time   NA 136 06/28/2020 1102   K 4.1 06/28/2020 1102   CL 104 06/28/2020 1102   CO2 23 06/28/2020 1102   GLUCOSE 116 (H) 06/28/2020 1102   BUN 10 06/28/2020 1102   CREATININE 0.62 06/28/2020 1102   CALCIUM 8.7 (L) 06/28/2020 1102   GFRNONAA >60 06/28/2020 1102    Lipid Panel  No results found for: CHOL, TRIG, HDL, CHOLHDL, VLDL, LDLCALC  CBC    Component Value Date/Time   WBC 6.4 06/28/2020 1102   RBC 4.03 06/28/2020 1102   HGB 10.9 (L) 06/28/2020 1102   HCT 35.0 (L) 06/28/2020 1102   PLT 326 06/28/2020 1102   MCV 86.8 06/28/2020 1102   MCH 27.0 06/28/2020 1102   MCHC 31.1 06/28/2020 1102   RDW 13.1 06/28/2020 1102   LYMPHSABS 2.1 06/28/2020 1102   MONOABS 0.5 06/28/2020 1102   EOSABS 0.5 06/28/2020 1102   BASOSABS 0.1 06/28/2020 1102    Hgb A1C No results found for: HGBA1C         Assessment & Plan:  Preventative Health  Maintenance:  Encouraged her to get a flu shot in the fall Tetanus UTD per her report Encouraged her to get her COVID booster Pap smear UTD will abstract this at the epic Mammogram UTD She declines referral to GI for screening colonoscopy at this time Encouraged her to consume a balanced diet and exercise regimen Advised her to see an eye doctor and dentist annually We will check HFP, lipid, A1c, today  RTC in 1 year, sooner if needed Webb Silversmith, NP This visit occurred during the SARS-CoV-2 public health emergency.  Safety protocols were in place,  including screening questions prior to the visit, additional usage of staff PPE, and extensive cleaning of exam room while observing appropriate contact time as indicated for disinfecting solutions.

## 2020-08-10 NOTE — Assessment & Plan Note (Signed)
Encourage low-carb diet and exercise weight loss

## 2020-08-10 NOTE — Telephone Encounter (Signed)
Results printed from [facility] website and given to MD.

## 2020-08-10 NOTE — Assessment & Plan Note (Signed)
Still menstruating Rx for iron 325 mg p.o. daily sent to pharmacy Encouraged high-fiber diet and adequate water intake

## 2020-08-10 NOTE — Consult Note (Signed)
NEW PATIENT EVALUATION  Name: Annette Le  MRN: [id number]  Date:   08/10/2020     DOB: September 14, 1970   This 50 y.o. 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   PREVIOUS INVESTIGATIONS:  Mammogram and ultrasound reviewed Pathology report reviewed Clinical notes reviewed  HPI: Patient is a 50 year old female who presented with self palpated mass in her right breast.  Ultrasound and tomography were performed showing a mass in the area of the palpable marker in the upper retroareolar breast.  This was all in the right breast.  There are also noted to adjacent areas of hypoechoic masses at 12:00 1 cm from the nipple which on reevaluation were thought to represent a single mass.  No suspicious adenopathy was identified.  Targeted ultrasound of the right breast mass was positive for mucinous carcinoma.  The area of biopsy of the 2 additional masses were benign.  She went on to have a wide local excision again showing ER/PR positive HER2 negative mucinous carcinoma 1.5 cm in greatest dimension.  4 sentinel lymph nodes were all negative for malignancy.  Margins were clear but close at 2 mm.  She has been seen by medical oncology is not thought to be in a candidate for systemic chemotherapy she is now referred to radiation oncology for opinion.  She is doing well.  She specifically denies breast tenderness cough or bone pain she is having some decreased motion in her right upper extremity in the region of the axillary lymph node sampling although that seems to be resolving.   PLANNED TREATMENT REGIMEN: Right hypofractionated whole breast radiation  PAST  MEDICAL HISTORY:  has a past medical history of Allergy, Asthma, Carpal tunnel syndrome, Family history of breast cancer, Family history of colon cancer, Family history of pancreatic cancer, Family history of stomach cancer, and Knee injury.    PAST SURGICAL HISTORY:  Past Surgical History:  Procedure Laterality Date   BREAST BIOPSY Right 06/22/2020   u/s bx @ 11:00-"vision" clip-INVASIVE MAMMARY CARCINOMA WITH MUCINOUS FEATURES.   BREAST BIOPSY Right 06/29/2020   [place] bx at 12:00, x marker, benign   BREAST LUMPECTOMY Right 07/15/2020   Rt 11:00 [place] tag placement tag [id number] surgery to follow    [place] BIOPSY Right 07/25/2020   Procedure: BREAST [place] LYMPH NODE BIOPSY;  Surgeon: Ronny Bacon, MD;  Location: [facility] ORS;  Service: General;  Laterality: Right;    FAMILY HISTORY: family history includes Breast cancer in her cousin; Cancer (age of onset: 3) in her father; Colon cancer in her maternal aunt; Lung cancer in her maternal aunt; Pancreatic cancer in her maternal grandfather; Stomach cancer in her maternal aunt.  SOCIAL HISTORY:  reports that she quit smoking about 16 years ago. Her smoking use included cigarettes. She has never used smokeless tobacco. She reports current alcohol use. She reports that she does not use drugs.  ALLERGIES: Shrimp extract allergy skin test, Ibuprofen, Shrimp [shellfish allergy], Tylenol [acetaminophen], and Other  MEDICATIONS:  Current Outpatient Medications  Medication Sig Dispense Refill   Iron, Ferrous Sulfate, 325 (65 Fe) MG TABS Take 325 mg by mouth daily. [place]  tablet 1   No current facility-administered medications for this encounter.    ECOG PERFORMANCE STATUS:  0 - Asymptomatic  REVIEW OF SYSTEMS: Patient denies any weight loss, fatigue, weakness, fever, chills or night sweats. Patient denies any loss of vision, blurred vision. Patient denies  any ringing  of the ears or hearing loss. No irregular heartbeat. Patient denies heart murmur or history of fainting. Patient denies any chest pain or pain radiating to her upper extremities. Patient denies any shortness of breath, difficulty breathing at night, cough or hemoptysis. Patient denies any swelling in the lower legs. Patient denies any nausea vomiting, vomiting of blood, or coffee ground material in the vomitus. Patient denies any stomach pain. Patient states has had normal bowel movements no significant constipation or diarrhea. Patient denies any dysuria, hematuria or significant nocturia. Patient denies any problems walking, swelling in the joints or loss of balance. Patient denies any skin changes, loss of hair or loss of weight. Patient denies any excessive worrying or anxiety or significant depression. Patient denies any problems with insomnia. Patient denies excessive thirst, polyuria, polydipsia. Patient denies any swollen glands, patient denies easy bruising or easy bleeding. Patient denies any recent infections, allergies or URI. Patient "s visual fields have not changed significantly in recent time.   PHYSICAL EXAM: BP 107/73   Pulse 81   Temp (!) 97.2 F (36.2 C) (Tympanic)   Resp 16   Wt 157 lb 9.6 oz (71.5 kg)   BMI 27.92 kg/m  She status post wide local excision and sentinel node biopsy of the right breast both incisions are healing well no dominant masses noted in either breast no axillary or supraclavicular adenopathy is appreciated.  Well-developed well-nourished patient in NAD. HEENT reveals PERLA, EOMI, discs not visualized.  Oral cavity is clear. No oral mucosal lesions are identified. Neck is clear without evidence of cervical or supraclavicular adenopathy. Lungs are clear to A&P. Cardiac examination is essentially unremarkable with regular rate and rhythm without murmur rub or thrill. Abdomen is benign with no organomegaly or masses noted. Motor sensory and DTR levels  are equal and symmetric in the upper and lower extremities. Cranial nerves II through XII are grossly intact. Proprioception is intact. No peripheral adenopathy or edema is identified. No motor or sensory levels are noted. Crude visual fields are within normal range.  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  I would also boost her scar another 1000 centigrade based on the 2 mm margin.  Risks and benefits of treatment including skin reaction fatigue alteration of blood counts possible inclusion of superficial lung all were reviewed in detail with the patient.  She seems to comprehend my treatment plan well.  I have personally set up and ordered CT simulation for next week.  Patient will also benefit from antiestrogen therapy after completion of radiation.  Patient and husband both seem to comprehend my treatment plan well.  I would like to take this opportunity to thank you for allowing me to participate in the care of your patient.Noreene Filbert, MD

## 2020-08-11 ENCOUNTER — Encounter: Payer: Self-pay | Admitting: Oncology

## 2020-08-11 LAB — HEPATIC FUNCTION PANEL
AG Ratio: 1.4 (calc) (ref 1.0–2.5)
ALT: 47 U/L — ABNORMAL HIGH (ref 6–29)
AST: 23 U/L (ref 10–35)
Albumin: 4.2 g/dL (ref 3.6–5.1)
Alkaline phosphatase (APISO): 119 U/L (ref 31–125)
Bilirubin, Direct: 0.1 mg/dL (ref 0.0–0.2)
Globulin: 2.9 g/dL (calc) (ref 1.9–3.7)
Indirect Bilirubin: 0.2 mg/dL (calc) (ref 0.2–1.2)
Total Bilirubin: 0.3 mg/dL (ref 0.2–1.2)
Total Protein: 7.1 g/dL (ref 6.1–8.1)

## 2020-08-11 LAB — LIPID PANEL
Cholesterol: 243 mg/dL — ABNORMAL HIGH (ref <200)
HDL: 65 mg/dL (ref >=50)
LDL Cholesterol (Calc): 147 mg/dL (calc) — ABNORMAL HIGH
Non-HDL Cholesterol (Calc): 178 mg/dL (calc) — ABNORMAL HIGH (ref <130)
Total CHOL/HDL Ratio: 3.7 (calc) (ref <5.0)
Triglycerides: 173 mg/dL — ABNORMAL HIGH (ref <150)

## 2020-08-11 LAB — HEMOGLOBIN A1C
Hgb A1c MFr Bld: 6.2 % of total Hgb — ABNORMAL HIGH (ref <5.7)
Mean Plasma Glucose: 131 mg/dL
eAG (mmol/L): 7.3 mmol/L

## 2020-08-16 ENCOUNTER — Ambulatory Visit
Admission: RE | Admit: 2020-08-16 | Discharge: 2020-08-16 | Disposition: A | Discharge status: Home / Self Care | Payer: 59 | Source: Ambulatory Visit | Attending: Radiation Oncology | Admitting: Radiation Oncology

## 2020-08-16 DIAGNOSIS — C50411 Malignant neoplasm of upper-outer quadrant of right female breast: Secondary | ICD-10-CM | POA: Insufficient documentation

## 2020-08-17 DIAGNOSIS — C50411 Malignant neoplasm of upper-outer quadrant of right female breast: Secondary | ICD-10-CM | POA: Diagnosis not present

## 2020-08-18 ENCOUNTER — Other Ambulatory Visit: Payer: Self-pay | Admitting: *Deleted

## 2020-08-18 DIAGNOSIS — Z170 Estrogen receptor positive status [ER+]: Secondary | ICD-10-CM

## 2020-08-19 DIAGNOSIS — C50411 Malignant neoplasm of upper-outer quadrant of right female breast: Secondary | ICD-10-CM | POA: Insufficient documentation

## 2020-08-23 ENCOUNTER — Ambulatory Visit: Admission: RE | Admit: 2020-08-23 | Payer: 59 | Source: Ambulatory Visit

## 2020-08-23 DIAGNOSIS — C50411 Malignant neoplasm of upper-outer quadrant of right female breast: Secondary | ICD-10-CM | POA: Diagnosis present

## 2020-08-24 ENCOUNTER — Ambulatory Visit
Admission: RE | Admit: 2020-08-24 | Discharge: 2020-08-24 | Disposition: A | Discharge status: Home / Self Care | Payer: 59 | Source: Ambulatory Visit | Attending: Radiation Oncology | Admitting: Radiation Oncology

## 2020-08-24 DIAGNOSIS — C50411 Malignant neoplasm of upper-outer quadrant of right female breast: Secondary | ICD-10-CM | POA: Diagnosis not present

## 2020-08-25 ENCOUNTER — Ambulatory Visit
Admission: RE | Admit: 2020-08-25 | Discharge: 2020-08-25 | Disposition: A | Discharge status: Home / Self Care | Payer: 59 | Source: Ambulatory Visit | Attending: Radiation Oncology | Admitting: Radiation Oncology

## 2020-08-25 DIAGNOSIS — C50411 Malignant neoplasm of upper-outer quadrant of right female breast: Secondary | ICD-10-CM | POA: Diagnosis not present

## 2020-08-26 ENCOUNTER — Ambulatory Visit
Admission: RE | Admit: 2020-08-26 | Discharge: 2020-08-26 | Disposition: A | Discharge status: Home / Self Care | Payer: 59 | Source: Ambulatory Visit | Attending: Radiation Oncology | Admitting: Radiation Oncology

## 2020-08-26 DIAGNOSIS — C50411 Malignant neoplasm of upper-outer quadrant of right female breast: Secondary | ICD-10-CM | POA: Diagnosis not present

## 2020-08-29 ENCOUNTER — Ambulatory Visit
Admission: RE | Admit: 2020-08-29 | Discharge: 2020-08-29 | Disposition: A | Discharge status: Home / Self Care | Payer: 59 | Source: Ambulatory Visit | Attending: Radiation Oncology | Admitting: Radiation Oncology

## 2020-08-29 DIAGNOSIS — C50411 Malignant neoplasm of upper-outer quadrant of right female breast: Secondary | ICD-10-CM | POA: Diagnosis not present

## 2020-08-30 ENCOUNTER — Ambulatory Visit
Admission: RE | Admit: 2020-08-30 | Discharge: 2020-08-30 | Disposition: A | Discharge status: Home / Self Care | Payer: 59 | Source: Ambulatory Visit | Attending: Radiation Oncology | Admitting: Radiation Oncology

## 2020-08-30 DIAGNOSIS — C50411 Malignant neoplasm of upper-outer quadrant of right female breast: Secondary | ICD-10-CM | POA: Diagnosis not present

## 2020-08-31 ENCOUNTER — Ambulatory Visit
Admission: RE | Admit: 2020-08-31 | Discharge: 2020-08-31 | Disposition: A | Discharge status: Home / Self Care | Payer: 59 | Source: Ambulatory Visit | Attending: Radiation Oncology | Admitting: Radiation Oncology

## 2020-08-31 DIAGNOSIS — C50411 Malignant neoplasm of upper-outer quadrant of right female breast: Secondary | ICD-10-CM | POA: Diagnosis not present

## 2020-09-01 ENCOUNTER — Ambulatory Visit
Admission: RE | Admit: 2020-09-01 | Discharge: 2020-09-01 | Disposition: A | Discharge status: Home / Self Care | Payer: 59 | Source: Ambulatory Visit | Attending: Radiation Oncology | Admitting: Radiation Oncology

## 2020-09-01 DIAGNOSIS — C50411 Malignant neoplasm of upper-outer quadrant of right female breast: Secondary | ICD-10-CM | POA: Diagnosis not present

## 2020-09-02 ENCOUNTER — Ambulatory Visit
Admission: RE | Admit: 2020-09-02 | Discharge: 2020-09-02 | Disposition: A | Discharge status: Home / Self Care | Payer: 59 | Source: Ambulatory Visit | Attending: Radiation Oncology | Admitting: Radiation Oncology

## 2020-09-02 DIAGNOSIS — C50411 Malignant neoplasm of upper-outer quadrant of right female breast: Secondary | ICD-10-CM | POA: Diagnosis not present

## 2020-09-05 ENCOUNTER — Ambulatory Visit
Admission: RE | Admit: 2020-09-05 | Discharge: 2020-09-05 | Disposition: A | Discharge status: Home / Self Care | Payer: 59 | Source: Ambulatory Visit | Attending: Radiation Oncology | Admitting: Radiation Oncology

## 2020-09-05 DIAGNOSIS — C50411 Malignant neoplasm of upper-outer quadrant of right female breast: Secondary | ICD-10-CM | POA: Diagnosis not present

## 2020-09-06 ENCOUNTER — Ambulatory Visit
Admission: RE | Admit: 2020-09-06 | Discharge: 2020-09-06 | Disposition: A | Discharge status: Home / Self Care | Payer: 59 | Source: Ambulatory Visit | Attending: Radiation Oncology | Admitting: Radiation Oncology

## 2020-09-06 ENCOUNTER — Ambulatory Visit: Payer: 59

## 2020-09-06 DIAGNOSIS — C50411 Malignant neoplasm of upper-outer quadrant of right female breast: Secondary | ICD-10-CM | POA: Diagnosis not present

## 2020-09-07 ENCOUNTER — Other Ambulatory Visit: Payer: Self-pay

## 2020-09-07 ENCOUNTER — Ambulatory Visit
Admission: RE | Admit: 2020-09-07 | Discharge: 2020-09-07 | Disposition: A | Discharge status: Home / Self Care | Payer: 59 | Source: Ambulatory Visit | Attending: Radiation Oncology | Admitting: Radiation Oncology

## 2020-09-07 ENCOUNTER — Inpatient Hospital Stay: Payer: 59 | Attending: Radiation Oncology

## 2020-09-07 DIAGNOSIS — C50411 Malignant neoplasm of upper-outer quadrant of right female breast: Secondary | ICD-10-CM

## 2020-09-07 LAB — CBC
HCT: 37.7 % (ref 36.0–46.0)
Hemoglobin: 12.2 g/dL (ref 12.0–15.0)
MCH: 27.4 pg (ref 26.0–34.0)
MCHC: 32.4 g/dL (ref 30.0–36.0)
MCV: 84.5 fL (ref 80.0–100.0)
Platelets: 264 10*3/uL (ref 150–400)
RBC: 4.46 MIL/uL (ref 3.87–5.11)
RDW: 17 % — ABNORMAL HIGH (ref 11.5–15.5)
WBC: 5.2 10*3/uL (ref 4.0–10.5)
nRBC: 0 % (ref 0.0–0.2)

## 2020-09-08 ENCOUNTER — Ambulatory Visit
Admission: RE | Admit: 2020-09-08 | Discharge: 2020-09-08 | Disposition: A | Discharge status: Home / Self Care | Payer: 59 | Source: Ambulatory Visit | Attending: Radiation Oncology | Admitting: Radiation Oncology

## 2020-09-08 DIAGNOSIS — C50411 Malignant neoplasm of upper-outer quadrant of right female breast: Secondary | ICD-10-CM | POA: Diagnosis not present

## 2020-09-09 ENCOUNTER — Ambulatory Visit
Admission: RE | Admit: 2020-09-09 | Discharge: 2020-09-09 | Disposition: A | Discharge status: Home / Self Care | Payer: 59 | Source: Ambulatory Visit | Attending: Radiation Oncology | Admitting: Radiation Oncology

## 2020-09-09 DIAGNOSIS — C50411 Malignant neoplasm of upper-outer quadrant of right female breast: Secondary | ICD-10-CM | POA: Diagnosis not present

## 2020-09-12 ENCOUNTER — Ambulatory Visit
Admission: RE | Admit: 2020-09-12 | Discharge: 2020-09-12 | Disposition: A | Discharge status: Home / Self Care | Payer: 59 | Source: Ambulatory Visit | Attending: Radiation Oncology | Admitting: Radiation Oncology

## 2020-09-12 DIAGNOSIS — C50411 Malignant neoplasm of upper-outer quadrant of right female breast: Secondary | ICD-10-CM | POA: Diagnosis not present

## 2020-09-13 ENCOUNTER — Ambulatory Visit
Admission: RE | Admit: 2020-09-13 | Discharge: 2020-09-13 | Disposition: A | Discharge status: Home / Self Care | Payer: 59 | Source: Ambulatory Visit | Attending: Radiation Oncology | Admitting: Radiation Oncology

## 2020-09-13 DIAGNOSIS — C50411 Malignant neoplasm of upper-outer quadrant of right female breast: Secondary | ICD-10-CM | POA: Diagnosis not present

## 2020-09-14 ENCOUNTER — Ambulatory Visit
Admission: RE | Admit: 2020-09-14 | Discharge: 2020-09-14 | Disposition: A | Discharge status: Home / Self Care | Payer: 59 | Source: Ambulatory Visit | Attending: Radiation Oncology | Admitting: Radiation Oncology

## 2020-09-14 DIAGNOSIS — C50411 Malignant neoplasm of upper-outer quadrant of right female breast: Secondary | ICD-10-CM | POA: Diagnosis not present

## 2020-09-15 ENCOUNTER — Telehealth: Payer: Self-pay

## 2020-09-15 ENCOUNTER — Ambulatory Visit
Admission: RE | Admit: 2020-09-15 | Discharge: 2020-09-15 | Disposition: A | Discharge status: Home / Self Care | Payer: 59 | Source: Ambulatory Visit | Attending: Radiation Oncology | Admitting: Radiation Oncology

## 2020-09-15 DIAGNOSIS — C50411 Malignant neoplasm of upper-outer quadrant of right female breast: Secondary | ICD-10-CM | POA: Diagnosis not present

## 2020-09-15 NOTE — Telephone Encounter (Signed)
patient scheduled for final XRT on 8/3/ 22. Please schedule MD visit 2 weeks after and notify pt of appt. thanks ([place] pt)

## 2020-09-16 ENCOUNTER — Ambulatory Visit
Admission: RE | Admit: 2020-09-16 | Discharge: 2020-09-16 | Disposition: A | Discharge status: Home / Self Care | Payer: 59 | Source: Ambulatory Visit | Attending: Radiation Oncology | Admitting: Radiation Oncology

## 2020-09-16 DIAGNOSIS — C50411 Malignant neoplasm of upper-outer quadrant of right female breast: Secondary | ICD-10-CM | POA: Diagnosis not present

## 2020-09-19 ENCOUNTER — Ambulatory Visit
Admission: RE | Admit: 2020-09-19 | Discharge: 2020-09-19 | Disposition: A | Discharge status: Home / Self Care | Payer: 59 | Source: Ambulatory Visit | Attending: Radiation Oncology | Admitting: Radiation Oncology

## 2020-09-19 DIAGNOSIS — Z510 Encounter for antineoplastic radiation therapy: Secondary | ICD-10-CM | POA: Diagnosis present

## 2020-09-19 DIAGNOSIS — C50411 Malignant neoplasm of upper-outer quadrant of right female breast: Secondary | ICD-10-CM | POA: Diagnosis not present

## 2020-09-20 ENCOUNTER — Ambulatory Visit
Admission: RE | Admit: 2020-09-20 | Discharge: 2020-09-20 | Disposition: A | Discharge status: Home / Self Care | Payer: 59 | Source: Ambulatory Visit | Attending: Radiation Oncology | Admitting: Radiation Oncology

## 2020-09-20 DIAGNOSIS — Z510 Encounter for antineoplastic radiation therapy: Secondary | ICD-10-CM | POA: Diagnosis not present

## 2020-09-21 ENCOUNTER — Ambulatory Visit
Admission: RE | Admit: 2020-09-21 | Discharge: 2020-09-21 | Disposition: A | Discharge status: Home / Self Care | Payer: 59 | Source: Ambulatory Visit | Attending: Radiation Oncology | Admitting: Radiation Oncology

## 2020-09-21 DIAGNOSIS — Z510 Encounter for antineoplastic radiation therapy: Secondary | ICD-10-CM | POA: Diagnosis not present

## 2020-10-05 ENCOUNTER — Encounter: Payer: Self-pay | Admitting: Oncology

## 2020-10-05 ENCOUNTER — Inpatient Hospital Stay: Payer: 59 | Attending: Oncology | Admitting: Oncology

## 2020-10-05 VITALS — BP 106/76 | HR 71 | Temp 98.1°F | Resp 18 | Wt 159.3 lb

## 2020-10-05 DIAGNOSIS — C50919 Malignant neoplasm of unspecified site of unspecified female breast: Secondary | ICD-10-CM

## 2020-10-05 DIAGNOSIS — C50411 Malignant neoplasm of upper-outer quadrant of right female breast: Secondary | ICD-10-CM | POA: Insufficient documentation

## 2020-10-05 DIAGNOSIS — Z170 Estrogen receptor positive status [ER+]: Secondary | ICD-10-CM | POA: Diagnosis not present

## 2020-10-05 DIAGNOSIS — D509 Iron deficiency anemia, unspecified: Secondary | ICD-10-CM | POA: Insufficient documentation

## 2020-10-05 MED ORDER — TAMOXIFEN CITRATE 20 MG PO TABS: 20.0000 mg | ORAL_TABLET | Freq: Every day | ORAL | 1 refills | Status: DC

## 2020-10-05 NOTE — Progress Notes (Signed)
Pt here for follow up. Reports having some tingling to right breast

## 2020-10-06 NOTE — Progress Notes (Signed)
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  female with PMH listed below was seen in consultation at the request of  Jearld Fenton, NP  for evaluation of breast cancer.   She felt a painful right breast nodule for about 6 weeks. Went to see primary care physician and mammogram was obtained.  06/17/2020 bilateral diagnostic mammogram showed 14 x 15 x 12 mm right upper outer breast mass, 11 o'clock 2cm from nipple. close to the site of palpable/painful concern. And 2 other probably benign mass at 12 o'clock.   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  Grade 2, DCIS not identified.  LVI not identified.  Patient is scheduled to have additional ultrasound-guided biopsy of the 2 masses at 12 o'clock position of the right breast. Patient presents to establish care discussed.  She has appointment scheduled to see surgery with Dr.Rodenberg.  She was accompanied by her husband.  Family history of breast cancer: Maternal aunt was diagnosed with breast cancer at the age of 38 Family history of other cancers: Maternal aunt stomach cancer, maternal grandfather pancreatic cancer, maternal aunt lung cancer-also a smoker.  Menarche: 50 years of age  premenopausal, LMP 06/22/2020 Number of pregnancies :  Age at first live childbirth: Used OCP: Remote use for a few months. Used estrogen and progesterone therapy: Denies History of Radiation to the chest: Denies Previous of breast biopsy: Denies  06/29/2020, right breast 12:00 retroareolar ultrasound-guided biopsy showed negative for  cancer 07/08/2020, MRI breast bilateral with and without contrast showed known biopsy-proven malignancy over the upper outer quadrant of the right breast.  No additional suspicious masses or abdominal enhancement within either breast. 07/25/2020 underwent right breast lumpectomy and sentinel lymph node biopsy Pathology showed mucinous carcinoma, 1.5 cm, grade 2, 4 sentinel lymph nodes were excised and 0 was involved with malignancy.  Margins were all negative.  pT1c pN0, Per previous biopsy, ER 90%, PR 90%, HER2 negative by immunohistochemistry Case was discussed on tumor board on 08/01/20 Consensus was reached to proceed with adjuvant radiation followed by antiestrogen treatments.  INTERVAL HISTORY Annette Le is a 50 y.o. female who has above history reviewed by me today presents for follow up visit for management of right breast cancer 08/24/2020-09/14/2020 adjuvant radiation She had some skin irritation secondary to recent radiation.  Manageable symptoms.  Review of Systems  Constitutional:  Negative for appetite change, chills, fatigue and fever.  HENT:   Negative for hearing loss and voice change.   Eyes:  Negative for eye problems.  Respiratory:  Negative for chest tightness and cough.   Cardiovascular:  Negative for chest pain.  Gastrointestinal:  Negative for abdominal distention, abdominal pain and blood in stool.  Endocrine: Negative for hot flashes.  Genitourinary:  Negative for difficulty urinating and frequency.   Musculoskeletal:  Positive for back pain. Negative for arthralgias.  Skin:  Negative for itching and rash.  Neurological:  Negative for extremity weakness.  Hematological:  Negative for adenopathy.  Psychiatric/Behavioral:  Negative for confusion.  right breast nodule  MEDICAL HISTORY:  Past Medical History:  Diagnosis Date   Allergy    Asthma  Carpal tunnel syndrome    Family history of breast cancer    Family history of colon cancer    Family history of  pancreatic cancer    Family history of stomach cancer    Knee injury     SURGICAL HISTORY: Past Surgical History:  Procedure Laterality Date   BREAST BIOPSY Right 06/22/2020   u/s bx @ 11:00-"vision" clip-INVASIVE MAMMARY CARCINOMA WITH MUCINOUS FEATURES.   BREAST BIOPSY Right 06/29/2020   [place] bx at 12:00, x marker, benign   BREAST LUMPECTOMY Right 07/15/2020   Rt 11:00 [place] tag placement tag [id number] surgery to follow    [place] BIOPSY Right 07/25/2020   Procedure: BREAST LUMPECTOMY,RADIO FREQ LOCALIZER,AXILLARY SENTINEL LYMPH NODE BIOPSY;  Surgeon: Ronny Bacon, MD;  Location: [facility] ORS;  Service: General;  Laterality: Right;    SOCIAL HISTORY: Social History   Socioeconomic History   Marital status: Married    Spouse name: Not on file   Number of children: Not on file   Years of education: Not on file   Highest education level: Not on file  Occupational History   Not on file  Tobacco Use   Smoking status: Former    Types: Cigarettes    Quit date: 10/11/2003    Years since quitting: 17.0   Smokeless tobacco: Never  Vaping Use   Vaping Use: Never used  Substance and Sexual Activity   Alcohol use: Yes    Comment: occ   Drug use: Never   Sexual activity: Not on file  Other Topics Concern   Not on file  Social History Narrative   Not on file   Social Determinants of Health   Financial Resource Strain: Not on file  Food Insecurity: Not on file  Transportation Needs: Not on file  Physical Activity: Not on file  Stress: Not on file  Social Connections: Not on file  Intimate Partner Violence: Not on file    FAMILY HISTORY: Family History  Problem Relation Age of Onset   Colon cancer Maternal Aunt    Lung cancer Maternal Aunt    Stomach cancer Maternal Aunt    Cancer Father 20       intestinal vs. stomach   Pancreatic cancer Maternal Grandfather        d. 44s   Breast cancer Cousin     ALLERGIES:   is allergic to shrimp extract allergy skin test, ibuprofen, shrimp [shellfish allergy], tylenol [acetaminophen], and other.  MEDICATIONS:  Current Outpatient Medications  Medication Sig Dispense Refill   Ascorbic Acid (VITAMIN C PO) Take by mouth.     Multiple Vitamin (MULTIVITAMIN) tablet Take 1 tablet by mouth daily.     tamoxifen (NOLVADEX) 20 MG tablet Take 1 tablet (20 mg total) by mouth daily. 30 tablet 1   VITAMIN E PO Take by mouth.     Iron, Ferrous Sulfate, 325 (65 Fe) MG TABS Take 325 mg by mouth daily. (Patient not taking: Reported on 10/05/2020) 90 tablet 1   No current facility-administered medications for this visit.     PHYSICAL EXAMINATION: ECOG PERFORMANCE STATUS: 1 - Symptomatic but completely ambulatory Vitals:   10/05/20 1453  BP: 106/76  Pulse: 71  Resp: 18  Temp: 98.1 F (36.7 C)   Filed Weights   10/05/20 1453  Weight: 159 lb 4.8 oz (72.3 kg)    Physical Exam Constitutional:      General: She is not in acute distress. HENT:  Head: Normocephalic and atraumatic.  Eyes:     General: No scleral icterus. Cardiovascular:     Rate and Rhythm: Normal rate and regular rhythm.     Heart sounds: Normal heart sounds.  Pulmonary:     Effort: Pulmonary effort is normal. No respiratory distress.     Breath sounds: No wheezing.  Abdominal:     General: Bowel sounds are normal. There is no distension.     Palpations: Abdomen is soft.  Musculoskeletal:        General: No deformity. Normal range of motion.     Cervical back: Normal range of motion and neck supple.  Skin:    General: Skin is warm and dry.     Findings: No erythema or rash.  Neurological:     Mental Status: She is alert and oriented to person, place, and time. Mental status is at baseline.     Cranial Nerves: No cranial nerve deficit.     Coordination: Coordination normal.  Psychiatric:        Mood and Affect: Mood normal.    LABORATORY DATA:  I have reviewed the data as listed Lab  Results  Component Value Date   WBC 5.2 09/07/2020   HGB 12.2 09/07/2020   HCT 37.7 09/07/2020   MCV 84.5 09/07/2020   PLT 264 09/07/2020   Recent Labs    06/28/20 1102 08/10/20 0832  NA 136  --   K 4.1  --   CL 104  --   CO2 23  --   GLUCOSE 116*  --   BUN 10  --   CREATININE 0.62  --   CALCIUM 8.7*  --   GFRNONAA >60  --   PROT 6.7 7.1  ALBUMIN 3.8  --   AST 26 23  ALT 34 47*  ALKPHOS 76  --   BILITOT 0.6 0.3  BILIDIR  --  0.1  IBILI  --  0.2    Iron/TIBC/Ferritin/ %Sat    Component Value Date/Time   IRON 214 (H) 06/28/2020 1102   TIBC 438 06/28/2020 1102   FERRITIN 8 (L) 06/28/2020 1102   IRONPCTSAT 49 (H) 06/28/2020 1102      RADIOGRAPHIC STUDIES: I have personally reviewed the radiological images as listed and agreed with the findings in the report. No results found.     ASSESSMENT & PLAN:  1. Invasive carcinoma of breast ([place])   2. Iron deficiency anemia, unspecified iron deficiency anemia type   Cancer Staging Invasive carcinoma of breast ([place]) Staging form: Breast, AJCC 8th Edition - Pathologic stage from 08/03/2020: Stage IA (pT1c, pN0, cM0, G2, ER+, PR+, HER2-) - Signed by Earlie Server, MD on 08/03/2020  Right breast Stage 1A mucinous carcinoma. Case was discussed on tumor board on 08/01/2020, per Dr. Reuel Derby, this is a few mucinous carcinoma, favorable histology. Status post adjuvant radiation Discussed with patient about the rationale of antiestrogen treatments. She is premenopausal/perimenopausal.  Last MMP 2 months ago. Recommend tamoxifen 20 mg daily.  She agrees with the plan.  Prescription was sent to pharmacy. Recommend annual pelvic examination by gynecologist.   Anemia, 06/28/2020, hemoglobin 10.9, ferritin 8.  Recommend patient to start oral iron supplementation ferrous sulfate 1-2 times daily.  I will repeat iron labs at the next visit  Family history of cancer, genetic testing is negative.   VUS in POLE and TERT  Orders Placed This  Encounter  Procedures   CBC with Differential/Platelet    Standing Status:   Future  Standing Expiration Date:   10/05/2021   Comprehensive metabolic panel    Standing Status:   Future    Standing Expiration Date:   10/05/2021   Ferritin    Standing Status:   Future    Standing Expiration Date:   10/05/2021   Iron and TIBC    Standing Status:   Future    Standing Expiration Date:   10/05/2021    All questions were answered. The patient knows to call the clinic with any problems questions or concerns.  cc Jearld Fenton, NP    Return of visit: 6 weeks T   Earlie Server, MD, PhD Hematology Oncology [facility] Pager- [phone] 10/06/2020

## 2020-10-21 ENCOUNTER — Encounter: Payer: Self-pay | Admitting: Radiation Oncology

## 2020-10-21 ENCOUNTER — Ambulatory Visit
Admission: RE | Admit: 2020-10-21 | Discharge: 2020-10-21 | Disposition: A | Discharge status: Home / Self Care | Payer: 59 | Source: Ambulatory Visit | Attending: Radiation Oncology | Admitting: Radiation Oncology

## 2020-10-21 VITALS — BP 104/88 | HR 71 | Temp 97.8°F | Wt 157.0 lb

## 2020-10-21 DIAGNOSIS — Z170 Estrogen receptor positive status [ER+]: Secondary | ICD-10-CM | POA: Insufficient documentation

## 2020-10-21 DIAGNOSIS — Z79810 Long term (current) use of selective estrogen receptor modulators (SERMs): Secondary | ICD-10-CM | POA: Diagnosis not present

## 2020-10-21 DIAGNOSIS — C50411 Malignant neoplasm of upper-outer quadrant of right female breast: Secondary | ICD-10-CM | POA: Diagnosis present

## 2020-10-21 DIAGNOSIS — Z923 Personal history of irradiation: Secondary | ICD-10-CM | POA: Diagnosis not present

## 2020-10-21 NOTE — Progress Notes (Signed)
Radiation Oncology Follow up Note  Name: Annette Le   Date:   10/21/2020 MRN:  [name] DOB: 1970-04-18    This 50 y.o. 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  Seen today in routine follow-up she is doing well.  She specifically denies breast tenderness cough or bone pain.  She has been started on tamoxifen tolerant at well without side effect.  COMPLICATIONS OF TREATMENT: none  FOLLOW UP COMPLIANCE: keeps appointments   PHYSICAL EXAM:  BP 104/88   Pulse 71   Temp 97.8 F (36.6 C) (Tympanic)   Wt 157 lb (71.2 kg)   BMI 27.81 kg/m  Lungs are clear to A&P cardiac examination essentially unremarkable with regular rate and rhythm. No dominant mass or nodularity is noted in either breast in 2 positions examined. Incision is well-healed. No axillary or supraclavicular adenopathy is appreciated. Cosmetic result is excellent.  Well-developed well-nourished patient in NAD. HEENT reveals PERLA, EOMI, discs not visualized.  Oral cavity is clear. No oral mucosal lesions are identified. Neck is clear without evidence of cervical or supraclavicular adenopathy. Lungs are clear to A&P. Cardiac examination is essentially unremarkable with regular rate and rhythm without murmur rub or thrill. Abdomen is benign with no organomegaly or masses noted. Motor sensory and DTR levels are equal and symmetric in the upper and lower extremities. Cranial nerves II through XII are grossly intact. Proprioception is intact. No peripheral adenopathy or edema is identified. No motor or sensory levels are noted. Crude visual fields are within normal range.  RADIOLOGY RESULTS: No  current films to review  PLAN: Present time patient is doing well 1 month out from whole breast radiation and pleased with her overall progress.  I have asked to see her back in 4 to 5 months for follow-up.  She continues on tamoxifen without side effect.  Patient knows to call with any concerns.  I would like to take this opportunity to thank you for allowing me to participate in the care of your patient.Noreene Filbert, MD

## 2020-11-02 ENCOUNTER — Other Ambulatory Visit: Payer: Self-pay | Admitting: Oncology

## 2020-11-16 ENCOUNTER — Other Ambulatory Visit: Payer: Self-pay

## 2020-11-16 ENCOUNTER — Encounter: Payer: Self-pay | Admitting: Oncology

## 2020-11-16 ENCOUNTER — Inpatient Hospital Stay (HOSPITAL_BASED_OUTPATIENT_CLINIC_OR_DEPARTMENT_OTHER): Payer: 59 | Admitting: Oncology

## 2020-11-16 ENCOUNTER — Inpatient Hospital Stay: Payer: 59 | Attending: Oncology

## 2020-11-16 VITALS — BP 111/73 | HR 67 | Temp 98.3°F | Resp 14 | Wt 153.0 lb

## 2020-11-16 DIAGNOSIS — Z801 Family history of malignant neoplasm of trachea, bronchus and lung: Secondary | ICD-10-CM | POA: Diagnosis not present

## 2020-11-16 DIAGNOSIS — Z803 Family history of malignant neoplasm of breast: Secondary | ICD-10-CM | POA: Insufficient documentation

## 2020-11-16 DIAGNOSIS — N6322 Unspecified lump in the left breast, upper inner quadrant: Secondary | ICD-10-CM | POA: Diagnosis not present

## 2020-11-16 DIAGNOSIS — Z79810 Long term (current) use of selective estrogen receptor modulators (SERMs): Secondary | ICD-10-CM | POA: Diagnosis not present

## 2020-11-16 DIAGNOSIS — Z886 Allergy status to analgesic agent status: Secondary | ICD-10-CM | POA: Diagnosis not present

## 2020-11-16 DIAGNOSIS — C50919 Malignant neoplasm of unspecified site of unspecified female breast: Secondary | ICD-10-CM

## 2020-11-16 DIAGNOSIS — R232 Flushing: Secondary | ICD-10-CM | POA: Diagnosis not present

## 2020-11-16 DIAGNOSIS — D696 Thrombocytopenia, unspecified: Secondary | ICD-10-CM | POA: Diagnosis not present

## 2020-11-16 DIAGNOSIS — C50411 Malignant neoplasm of upper-outer quadrant of right female breast: Secondary | ICD-10-CM | POA: Insufficient documentation

## 2020-11-16 DIAGNOSIS — Z79899 Other long term (current) drug therapy: Secondary | ICD-10-CM | POA: Insufficient documentation

## 2020-11-16 DIAGNOSIS — N6321 Unspecified lump in the left breast, upper outer quadrant: Secondary | ICD-10-CM | POA: Diagnosis not present

## 2020-11-16 DIAGNOSIS — Z87891 Personal history of nicotine dependence: Secondary | ICD-10-CM | POA: Insufficient documentation

## 2020-11-16 DIAGNOSIS — Z170 Estrogen receptor positive status [ER+]: Secondary | ICD-10-CM | POA: Insufficient documentation

## 2020-11-16 DIAGNOSIS — Z800 Family history of malignant neoplasm of digestive organs: Secondary | ICD-10-CM | POA: Diagnosis not present

## 2020-11-16 LAB — CBC WITH DIFFERENTIAL/PLATELET
Abs Immature Granulocytes: 0.01 10*3/uL (ref 0.00–0.07)
Basophils Absolute: 0 10*3/uL (ref 0.0–0.1)
Basophils Relative: 1 %
Eosinophils Absolute: 0.3 10*3/uL (ref 0.0–0.5)
Eosinophils Relative: 6 %
HCT: 41.9 % (ref 36.0–46.0)
Hemoglobin: 13.7 g/dL (ref 12.0–15.0)
Immature Granulocytes: 0 %
Lymphocytes Relative: 28 %
Lymphs Abs: 1.4 10*3/uL (ref 0.7–4.0)
MCH: 28.2 pg (ref 26.0–34.0)
MCHC: 32.7 g/dL (ref 30.0–36.0)
MCV: 86.2 fL (ref 80.0–100.0)
Monocytes Absolute: 0.4 10*3/uL (ref 0.1–1.0)
Monocytes Relative: 7 %
Neutro Abs: 3 10*3/uL (ref 1.7–7.7)
Neutrophils Relative %: 58 %
Platelets: 144 10*3/uL — ABNORMAL LOW (ref 150–400)
RBC: 4.86 MIL/uL (ref 3.87–5.11)
RDW: 15.2 % (ref 11.5–15.5)
WBC: 5.1 10*3/uL (ref 4.0–10.5)
nRBC: 0 % (ref 0.0–0.2)

## 2020-11-16 LAB — FERRITIN: Ferritin: 104 ng/mL (ref 11–307)

## 2020-11-16 LAB — COMPREHENSIVE METABOLIC PANEL
ALT: 73 U/L — ABNORMAL HIGH (ref 0–44)
AST: 62 U/L — ABNORMAL HIGH (ref 15–41)
Albumin: 3.8 g/dL (ref 3.5–5.0)
Alkaline Phosphatase: 68 U/L (ref 38–126)
Anion gap: 9 (ref 5–15)
BUN: 11 mg/dL (ref 6–20)
CO2: 21 mmol/L — ABNORMAL LOW (ref 22–32)
Calcium: 8.4 mg/dL — ABNORMAL LOW (ref 8.9–10.3)
Chloride: 106 mmol/L (ref 98–111)
Creatinine, Ser: 0.73 mg/dL (ref 0.44–1.00)
GFR, Estimated: >60 mL/min (ref >60)
Glucose, Bld: 156 mg/dL — ABNORMAL HIGH (ref 70–99)
Potassium: 4.2 mmol/L (ref 3.5–5.1)
Sodium: 136 mmol/L (ref 135–145)
Total Bilirubin: 0.7 mg/dL (ref 0.3–1.2)
Total Protein: 7 g/dL (ref 6.5–8.1)

## 2020-11-16 LAB — IRON AND TIBC
Iron: 118 ug/dL (ref 28–170)
Saturation Ratios: 36 % — ABNORMAL HIGH (ref 10.4–31.8)
TIBC: 330 ug/dL (ref 250–450)
UIBC: 212 ug/dL

## 2020-11-16 NOTE — Progress Notes (Signed)
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  female with PMH listed below was seen in consultation at the request of  Jearld Fenton, NP  for evaluation of breast cancer.   She felt a painful right breast nodule for about 6 weeks. Went to see primary care physician and mammogram was obtained.  06/17/2020 bilateral diagnostic mammogram showed 14 x 15 x 12 mm right upper outer breast mass, 11 o'clock 2cm from nipple. close to the site of palpable/painful concern. And 2 other probably benign mass at 12 o'clock.   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  Grade 2, DCIS not identified.  LVI not identified.  Patient is scheduled to have additional ultrasound-guided biopsy of the 2 masses at 12 o'clock position of the right breast. Patient presents to establish care discussed.  She has appointment scheduled to see surgery with Dr.Rodenberg.  She was accompanied by her husband.  Family history of breast cancer: Maternal aunt was diagnosed with breast cancer at the age of 8 Family history of other cancers: Maternal aunt stomach cancer, maternal grandfather pancreatic cancer, maternal aunt lung cancer-also a smoker.  Menarche: 50 years of age  premenopausal, LMP 06/22/2020 Number of pregnancies :  Age at first live childbirth: Used OCP: Remote use for a few months. Used estrogen and progesterone therapy: Denies History of Radiation to the chest: Denies Previous of breast biopsy: Denies  06/29/2020, right breast  12:00 retroareolar ultrasound-guided biopsy showed negative for cancer 07/08/2020, MRI breast bilateral with and without contrast showed known biopsy-proven malignancy over the upper outer quadrant of the right breast.  No additional suspicious masses or abdominal enhancement within either breast. 07/25/2020 underwent right breast lumpectomy and sentinel lymph node biopsy Pathology showed mucinous carcinoma, 1.5 cm, grade 2, 4 sentinel lymph nodes were excised and 0 was involved with malignancy.  Margins were all negative.  pT1c pN0, Per previous biopsy, ER 90%, PR 90%, HER2 negative by immunohistochemistry Case was discussed on tumor board on 08/01/20 Consensus was reached to proceed with adjuvant radiation followed by antiestrogen treatments.  Family history of cancer, genetic testing is negative.   VUS in POLE and TERT  08/24/2020-09/14/2020 adjuvant radiation INTERVAL HISTORY Annette Le is a 50 y.o. female who has above history reviewed by me today presents for follow up visit for management of right breast cancer 10/05/2020 started on Tamoxifen. + hot flash. Manageable.    Review of Systems  Constitutional:  Negative for appetite change, chills, fatigue and fever.  HENT:   Negative for hearing loss and voice change.   Eyes:  Negative for eye problems.  Respiratory:  Negative for chest tightness and cough.   Cardiovascular:  Negative for chest pain.  Gastrointestinal:  Negative for abdominal distention, abdominal pain and blood in stool.  Endocrine: Positive for hot flashes.  Genitourinary:  Negative for difficulty urinating and frequency.   Musculoskeletal:  Negative for arthralgias and back pain.  Skin:  Negative for itching and rash.  Neurological:  Negative for extremity weakness.  Hematological:  Negative for adenopathy.  Psychiatric/Behavioral:  Negative for  confusion.    MEDICAL HISTORY:  Past Medical History:  Diagnosis Date   Allergy    Asthma    Carpal tunnel syndrome     Family history of breast cancer    Family history of colon cancer    Family history of pancreatic cancer    Family history of stomach cancer    Knee injury     SURGICAL HISTORY: Past Surgical History:  Procedure Laterality Date   BREAST BIOPSY Right 06/22/2020   u/s bx @ 11:00-"vision" clip-INVASIVE MAMMARY CARCINOMA WITH MUCINOUS FEATURES.   BREAST BIOPSY Right 06/29/2020   [place] bx at 12:00, x marker, benign   BREAST LUMPECTOMY Right 07/15/2020   Rt 11:00 [place] tag placement tag [id number] surgery to follow    [place] BIOPSY Right 07/25/2020   Procedure: BREAST LUMPECTOMY,RADIO FREQ LOCALIZER,AXILLARY SENTINEL LYMPH NODE BIOPSY;  Surgeon: Ronny Bacon, MD;  Location: [facility] ORS;  Service: General;  Laterality: Right;    SOCIAL HISTORY: Social History   Socioeconomic History   Marital status: Married    Spouse name: Not on file   Number of children: Not on file   Years of education: Not on file   Highest education level: Not on file  Occupational History   Not on file  Tobacco Use   Smoking status: Former    Types: Cigarettes    Quit date: 10/11/2003    Years since quitting: 17.1   Smokeless tobacco: Never  Vaping Use   Vaping Use: Never used  Substance and Sexual Activity   Alcohol use: Yes    Comment: occ   Drug use: Never   Sexual activity: Not on file  Other Topics Concern   Not on file  Social History Narrative   Not on file   Social Determinants of Health   Financial Resource Strain: Not on file  Food Insecurity: Not on file  Transportation Needs: Not on file  Physical Activity: Not on file  Stress: Not on file  Social Connections: Not on file  Intimate Partner Violence: Not on file    FAMILY HISTORY: Family History  Problem Relation Age of Onset   Colon cancer Maternal Aunt    Lung cancer Maternal Aunt    Stomach cancer Maternal Aunt    Cancer Father 76       intestinal vs. stomach    Pancreatic cancer Maternal Grandfather        d. 69s   Breast cancer Cousin     ALLERGIES:  is allergic to shrimp extract allergy skin test, ibuprofen, shrimp [shellfish allergy], tylenol [acetaminophen], and other.  MEDICATIONS:  Current Outpatient Medications  Medication Sig Dispense Refill   Ascorbic Acid (VITAMIN C PO) Take by mouth.     Calcium Carb-Cholecalciferol (CALTRATE 600+D3) 600-800 MG-UNIT TABS Take by mouth.     Multiple Vitamin (MULTIVITAMIN) tablet Take 1 tablet by mouth daily.     tamoxifen (NOLVADEX) 20 MG tablet TAKE 1 TABLET(20 MG) BY MOUTH DAILY 30 tablet 1   VITAMIN E PO Take by mouth.     No current facility-administered medications for this visit.     PHYSICAL EXAMINATION: ECOG PERFORMANCE STATUS: 0 - Asymptomatic Vitals:   11/16/20 1424  BP: 111/73  Pulse: 67  Resp: 14  Temp: 98.3 F (36.8 C)  SpO2: 99%   Filed Weights   11/16/20 1424  Weight: 153 lb (69.4 kg)    Physical Exam Constitutional:      General: She is not  in acute distress. HENT:     Head: Normocephalic and atraumatic.  Eyes:     General: No scleral icterus. Cardiovascular:     Rate and Rhythm: Normal rate and regular rhythm.     Heart sounds: Normal heart sounds.  Pulmonary:     Effort: Pulmonary effort is normal. No respiratory distress.     Breath sounds: No wheezing.  Abdominal:     General: Bowel sounds are normal. There is no distension.     Palpations: Abdomen is soft.  Musculoskeletal:        General: No deformity. Normal range of motion.     Cervical back: Normal range of motion and neck supple.  Skin:    General: Skin is warm and dry.     Findings: No erythema or rash.  Neurological:     Mental Status: She is alert and oriented to person, place, and time. Mental status is at baseline.     Cranial Nerves: No cranial nerve deficit.     Coordination: Coordination normal.  Psychiatric:        Mood and Affect: Mood normal.    LABORATORY DATA:  I have  reviewed the data as listed Lab Results  Component Value Date   WBC 5.1 11/16/2020   HGB 13.7 11/16/2020   HCT 41.9 11/16/2020   MCV 86.2 11/16/2020   PLT 144 (L) 11/16/2020   Recent Labs    06/28/20 1102 08/10/20 0832 11/16/20 1355  NA 136  --  136  K 4.1  --  4.2  CL 104  --  106  CO2 23  --  21*  GLUCOSE 116*  --  156*  BUN 10  --  11  CREATININE 0.62  --  0.73  CALCIUM 8.7*  --  8.4*  GFRNONAA >60  --  >60  PROT 6.7 7.1 7.0  ALBUMIN 3.8  --  3.8  AST 26 23 62*  ALT 34 47* 73*  ALKPHOS 76  --  68  BILITOT 0.6 0.3 0.7  BILIDIR  --  0.1  --   IBILI  --  0.2  --     Iron/TIBC/Ferritin/ %Sat    Component Value Date/Time   IRON 118 11/16/2020 1355   TIBC 330 11/16/2020 1355   FERRITIN 104 11/16/2020 1355   IRONPCTSAT 36 (H) 11/16/2020 1355      RADIOGRAPHIC STUDIES: I have personally reviewed the radiological images as listed and agreed with the findings in the report. No results found.     ASSESSMENT & PLAN:  1. Invasive carcinoma of breast ([place])   2. Long-term current use of tamoxifen   3. Thrombocytopenia ([place])   Cancer Staging Invasive carcinoma of breast ([place]) Staging form: Breast, AJCC 8th Edition - Pathologic stage from 08/03/2020: Stage IA (pT1c, pN0, cM0, G2, ER+, PR+, HER2-) - Signed by Earlie Server, MD on 08/03/2020  Right breast Stage 1A mucinous carcinoma. Case was discussed on tumor board on 08/01/2020, per Dr. Reuel Derby, this is a few mucinous carcinoma, favorable histology.Status post adjuvant radiation Currently on adjuvant Taxmoifen 21m daily she tolerates.  Continue current regimen.  Recommend annual pelvic examination by gynecologist.  Thrombocytopenia, she drinks alcohol sometimes. Recommend her to decrease alcohol consumption.     Orders Placed This Encounter  Procedures   CBC with Differential/Platelet    Standing Status:   Future    Standing Expiration Date:   11/16/2021   Comprehensive metabolic panel    Standing Status:    Future  Standing Expiration Date:   11/16/2021    All questions were answered. The patient knows to call the clinic with any problems questions or concerns.  cc Jearld Fenton, NP    Return of visit:  6 months.  Wallene Huh, MD, PhD 11/16/2020

## 2020-11-16 NOTE — Progress Notes (Signed)
Pt in for follow up, denies any concerns today. 

## 2020-12-01 ENCOUNTER — Other Ambulatory Visit: Payer: Self-pay | Admitting: *Deleted

## 2020-12-01 MED ORDER — TAMOXIFEN CITRATE 20 MG PO TABS: 20.0000 mg | ORAL_TABLET | Freq: Every day | ORAL | 1 refills | Status: DC

## 2020-12-26 ENCOUNTER — Other Ambulatory Visit: Payer: Self-pay

## 2020-12-26 DIAGNOSIS — Z170 Estrogen receptor positive status [ER+]: Secondary | ICD-10-CM

## 2020-12-26 DIAGNOSIS — C50411 Malignant neoplasm of upper-outer quadrant of right female breast: Secondary | ICD-10-CM

## 2021-02-07 ENCOUNTER — Other Ambulatory Visit: Payer: Self-pay

## 2021-02-07 ENCOUNTER — Ambulatory Visit
Admission: RE | Admit: 2021-02-07 | Discharge: 2021-02-07 | Disposition: A | Discharge status: Home / Self Care | Payer: 59 | Source: Ambulatory Visit | Attending: Surgery | Admitting: Surgery

## 2021-02-07 DIAGNOSIS — C50411 Malignant neoplasm of upper-outer quadrant of right female breast: Secondary | ICD-10-CM | POA: Insufficient documentation

## 2021-02-07 DIAGNOSIS — Z170 Estrogen receptor positive status [ER+]: Secondary | ICD-10-CM | POA: Insufficient documentation

## 2021-02-14 ENCOUNTER — Other Ambulatory Visit: Payer: Self-pay

## 2021-02-14 ENCOUNTER — Encounter: Payer: Self-pay | Admitting: Surgery

## 2021-02-14 ENCOUNTER — Ambulatory Visit (INDEPENDENT_AMBULATORY_CARE_PROVIDER_SITE_OTHER): Payer: 59 | Admitting: Surgery

## 2021-02-14 VITALS — BP 110/75 | HR 84 | Temp 98.8°F | Ht 62.2 in | Wt 147.0 lb

## 2021-02-14 DIAGNOSIS — Z170 Estrogen receptor positive status [ER+]: Secondary | ICD-10-CM

## 2021-02-14 DIAGNOSIS — C50411 Malignant neoplasm of upper-outer quadrant of right female breast: Secondary | ICD-10-CM

## 2021-02-14 NOTE — Patient Instructions (Signed)
Please be sure to keep your appointments with Dr.Chrystal and Dr.Yu. Please call with any questions or concerns.   Breast  Self-Awareness Breast self-awareness means being familiar with how your breasts look and feel. It involves checking your breasts regularly and reporting any changes to your health care provider. Practicing breast self-awareness is important. Sometimes changes may not be harmful (are benign), but sometimes a change in your breasts can be a sign of a serious medical problem. It is important to learn how to do this procedure correctly so that you can catch problems early, when treatment is more likely to be successful. All women should practice breast self-awareness, including women who have had breast implants. What you need: A mirror. A well-lit room. How to do a breast self-exam A breast self-exam is one way to learn what is normal for your breasts and whether your breasts are changing. To do a breast self-exam: Look for changes  Remove all the clothing above your waist. Stand in front of a mirror in a room with good lighting. Put your hands on your hips. Push your hands firmly downward. Compare your breasts in the mirror. Look for differences between them (asymmetry), such as: Differences in shape. Differences in size. Puckers, dips, and bumps in one breast and not the other. Look at each breast for changes in the skin, such as: Redness. Scaly areas. Look for changes in your nipples, such as: Discharge. Bleeding. Dimpling. Redness. A change in position. Feel for changes Carefully feel your breasts for lumps and changes. It is best to do this while lying on your back on the floor, and again while sitting or standing in the tub or shower with soapy water on your skin. Feel each breast in the following way: Place the arm on the side of the breast you are examining above your head. Feel your breast with the other hand. Start in the nipple area and make -inch (2  cm) overlapping circles to feel your breast. Use the pads of your three middle fingers to do this. Apply light pressure, then medium pressure, then firm pressure. The light pressure will allow you to feel the tissue closest to the skin. The medium pressure will allow you to feel the tissue that is a little deeper. The firm pressure will allow you to feel the tissue close to the ribs. Continue the overlapping circles, moving downward over the breast until you feel your ribs below your breast. Move one finger-width toward the center of the body. Continue to use the -inch (2 cm) overlapping circles to feel your breast as you move slowly up toward your collarbone. Continue the up-and-down exam using all three pressures until you reach your armpit.  Write down what you find Writing down what you find can help you remember what to discuss with your health care provider. Write down: What is normal for each breast. Any changes that you find in each breast, including: The kind of changes you find. Any pain or tenderness. Size and location of any lumps. Where you are in your menstrual cycle, if you are still menstruating. General tips and recommendations Examine your breasts every month. If you are breastfeeding, the best time to examine your breasts is after a feeding or after using a breast pump. If you menstruate, the best time to examine your breasts is 5-7 days after your period. Breasts are generally lumpier during menstrual periods, and it may be more difficult to notice changes. With time and practice, you will become  more familiar with the variations in your breasts and more comfortable with the exam. Contact a health care provider if you: See a change in the shape or size of your breasts or nipples. See a change in the skin of your breast or nipples, such as a reddened or scaly area. Have unusual discharge from your nipples. Find a lump or thick area that was not there before. Have pain in  your breasts. Have any concerns related to your breast health. Summary Breast self-awareness includes looking for physical changes in your breasts, as well as feeling for any changes within your breasts. Breast self-awareness should be performed in front of a mirror in a well-lit room. You should examine your breasts every month. If you menstruate, the best time to examine your breasts is 5-7 days after your menstrual period. Let your health care provider know of any changes you notice in your breasts, including changes in size, changes on the skin, pain or tenderness, or unusual fluid from your nipples. This information is not intended to replace advice given to you by your health care provider. Make sure you discuss any questions you have with your health care provider. Document Revised: 09/24/2017 Document Reviewed: 09/24/2017 Elsevier Patient Education  [place].

## 2021-02-14 NOTE — Progress Notes (Signed)
Surgical Clinic Progress/Follow-up Note   HPI:  50 y.o. Female presents to clinic for right breast cancer follow-up, she had breast conservation surgery last June.  She has since completed her radiation therapy, reports minimal lingering tingling in the right breast.  Patient reports improvement/resolution of prior issues, denies N/V, fever/chills, CP, or SOB. She does note having some manageable hot flashes with tamoxifen.  Review of Systems:  Constitutional: denies fever/chills  ENT: denies sore throat, hearing problems  Respiratory: denies shortness of breath, wheezing  Cardiovascular: denies chest pain, palpitations  Gastrointestinal: denies abdominal pain, N/V, or diarrhea/and bowel function as per interval history Skin: Denies any other rashes as per interval history  Vital Signs:  BP 110/75    Pulse 84    Temp 98.8 F (37.1 C) (Oral)    Ht 5' 2.2" (1.58 m)    Wt 147 lb (66.7 kg)    SpO2 96%    BMI 26.71 kg/m    Physical Exam:  Constitutional:  -- Normal body habitus  -- Awake, alert, and oriented x3  Pulmonary:  -- No crackles -- Equal breath sounds bilaterally -- Breathing non-labored at rest Cardiovascular:  -- S1, S2 present  -- No pericardial rubs  Gastrointestinal:  -- Soft and non-distended, non-tender Right breast -- Post-surgical incisions all well-approximated without any peri-incisional erythema or drainage GU  --Freda Munro present as chaperone Musculoskeletal / Integumentary:  -- Wounds or skin discoloration: None appreciated  -- Extremities: B/L UE and LE FROM, hands and feet warm, no edema   Laboratory studies: No new pertinent labs.  Imaging: No new pertinent imaging available for review   Assessment:  50 y.o. yo Female with a problem list including...   Invasive carcinoma of breast ([place]) Staging form: Breast, AJCC 8th Edition - Pathologic stage from 08/03/2020: Stage IA (pT1c, pN0, cM0, G2, ER+, PR+, HER2-) - Signed by Earlie Server, MD on 08/03/2020    Right breast Stage 1A mucinous carcinoma. Case was discussed on tumor board on 08/01/2020, per Dr. Reuel Derby, this is a few mucinous carcinoma, favorable histology.Status post adjuvant radiation Currently on adjuvant Taxmoifen 74m daily she tolerates.   Patient Active Problem List   Diagnosis Date Noted   Overweight (BMI 25.0-29.9) 08/10/2020   [place] of [place] ([place] 06/28/2020   Anemia 06/28/2020    presents to clinic for follow-up evaluation of right breast cancer, progressing well.  Plan:              - return to clinic for follow-up with next set of mammography or as needed, instructed to call office if any questions or concerns  All of the above recommendations were discussed with the patient and patient's family, and all of patient's and family's questions were answered to their expressed satisfaction.  These notes generated with voice recognition software. I apologize for typographical errors.  DRonny Bacon MD, FACS [place]: [facility]. Office: [phone]

## 2021-03-24 ENCOUNTER — Ambulatory Visit: Payer: Self-pay | Admitting: Radiation Oncology

## 2021-03-29 ENCOUNTER — Other Ambulatory Visit: Payer: Self-pay

## 2021-03-29 ENCOUNTER — Encounter: Payer: Self-pay | Admitting: Radiation Oncology

## 2021-03-29 ENCOUNTER — Ambulatory Visit
Admission: RE | Admit: 2021-03-29 | Discharge: 2021-03-29 | Disposition: A | Discharge status: Home / Self Care | Payer: BC Managed Care – PPO | Source: Ambulatory Visit | Attending: Radiation Oncology | Admitting: Radiation Oncology

## 2021-03-29 VITALS — BP 101/71 | HR 76 | Temp 97.6°F | Resp 16 | Wt 152.3 lb

## 2021-03-29 DIAGNOSIS — C50411 Malignant neoplasm of upper-outer quadrant of right female breast: Secondary | ICD-10-CM | POA: Insufficient documentation

## 2021-03-29 DIAGNOSIS — Z923 Personal history of irradiation: Secondary | ICD-10-CM | POA: Diagnosis not present

## 2021-03-29 DIAGNOSIS — C50919 Malignant neoplasm of unspecified site of unspecified female breast: Secondary | ICD-10-CM

## 2021-03-29 DIAGNOSIS — Z79810 Long term (current) use of selective estrogen receptor modulators (SERMs): Secondary | ICD-10-CM | POA: Insufficient documentation

## 2021-03-29 DIAGNOSIS — Z170 Estrogen receptor positive status [ER+]: Secondary | ICD-10-CM | POA: Diagnosis not present

## 2021-03-29 DIAGNOSIS — Z853 Personal history of malignant neoplasm of breast: Secondary | ICD-10-CM | POA: Diagnosis not present

## 2021-03-29 DIAGNOSIS — Z08 Encounter for follow-up examination after completed treatment for malignant neoplasm: Secondary | ICD-10-CM | POA: Diagnosis not present

## 2021-03-29 NOTE — Progress Notes (Signed)
Survivorship Care Plan visit completed.  Treatment summary reviewed and given to patient.  ASCO answers booklet reviewed and given to patient.  CARE program and Cancer Transitions discussed with patient along with other resources cancer center offers to patients and caregivers.  Patient verbalized understanding.    

## 2021-03-29 NOTE — Progress Notes (Signed)
Radiation Oncology Follow up Note  Name: Annette Le   Date:   03/29/2021 MRN:  [id number] DOB: Oct 16, 1970    This 51 y.o. 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  REFERRING PROVIDER: Jearld Fenton, NP  HPI: Patient is a 51 year old female now out 6 months having completed whole breast radiation to her right breast for ER/PR positive stage Ia invasive mammary carcinoma seen today in routine follow-up she is doing well.  She specifically denies breast tenderness cough or bone pain..  She had a mammogram in December which I have reviewed was BI-RADS 2 benign.  She is currently on tamoxifen tolerating it well without side effects.  COMPLICATIONS OF TREATMENT: none  FOLLOW UP COMPLIANCE: keeps appointments   PHYSICAL EXAM:  BP 101/71 (BP Location: Right Arm, Patient Position: Sitting, Cuff Size: Normal)    Pulse 76    Temp 97.6 F (36.4 C) (Tympanic)    Resp 16    Wt 152 lb 4.8 oz (69.1 kg)    BMI 27.68 kg/m  Lungs are clear to A&P cardiac examination essentially unremarkable with regular rate and rhythm. No dominant mass or nodularity is noted in either breast in 2 positions examined. Incision is well-healed. No axillary or supraclavicular adenopathy is appreciated. Cosmetic result is excellent.  Well-developed well-nourished patient in NAD. HEENT reveals PERLA, EOMI, discs not visualized.  Oral cavity is clear. No oral mucosal lesions are identified. Neck is clear without evidence of cervical or supraclavicular adenopathy. Lungs are clear to A&P. Cardiac examination is essentially unremarkable with regular rate and rhythm without murmur rub or thrill. Abdomen is benign with no organomegaly or masses noted. Motor sensory and DTR levels are equal and symmetric in the upper and lower extremities. Cranial nerves II through XII are grossly  intact. Proprioception is intact. No peripheral adenopathy or edema is identified. No motor or sensory levels are noted. Crude visual fields are within normal range.  RADIOLOGY RESULTS: Mammograms reviewed compatible with above-stated findings  PLAN: Present time patient is doing well 6 months out with no evidence of disease.  She continues to do well.  She continues on tamoxifen without side effect.  I have asked to see her back in 6 months for follow-up.  Patient is to call with any concerns.  I would like to take this opportunity to thank you for allowing me to participate in the care of your patient.Noreene Filbert, MD

## 2021-05-16 ENCOUNTER — Encounter: Payer: Self-pay | Admitting: Oncology

## 2021-05-16 ENCOUNTER — Inpatient Hospital Stay: Payer: BC Managed Care – PPO | Attending: Oncology

## 2021-05-16 ENCOUNTER — Inpatient Hospital Stay (HOSPITAL_BASED_OUTPATIENT_CLINIC_OR_DEPARTMENT_OTHER): Payer: BC Managed Care – PPO | Admitting: Oncology

## 2021-05-16 ENCOUNTER — Other Ambulatory Visit: Payer: Self-pay

## 2021-05-16 VITALS — BP 125/79 | HR 66 | Temp 96.4°F | Ht 62.0 in | Wt 148.0 lb

## 2021-05-16 DIAGNOSIS — C50919 Malignant neoplasm of unspecified site of unspecified female breast: Secondary | ICD-10-CM

## 2021-05-16 DIAGNOSIS — R7989 Other specified abnormal findings of blood chemistry: Secondary | ICD-10-CM | POA: Diagnosis not present

## 2021-05-16 DIAGNOSIS — Z170 Estrogen receptor positive status [ER+]: Secondary | ICD-10-CM | POA: Diagnosis not present

## 2021-05-16 DIAGNOSIS — C50411 Malignant neoplasm of upper-outer quadrant of right female breast: Secondary | ICD-10-CM | POA: Insufficient documentation

## 2021-05-16 DIAGNOSIS — Z79810 Long term (current) use of selective estrogen receptor modulators (SERMs): Secondary | ICD-10-CM

## 2021-05-16 LAB — COMPREHENSIVE METABOLIC PANEL
ALT: 49 U/L — ABNORMAL HIGH (ref 0–44)
AST: 36 U/L (ref 15–41)
Albumin: 3.8 g/dL (ref 3.5–5.0)
Alkaline Phosphatase: 58 U/L (ref 38–126)
Anion gap: 7 (ref 5–15)
BUN: 9 mg/dL (ref 6–20)
CO2: 24 mmol/L (ref 22–32)
Calcium: 8.6 mg/dL — ABNORMAL LOW (ref 8.9–10.3)
Chloride: 108 mmol/L (ref 98–111)
Creatinine, Ser: 0.67 mg/dL (ref 0.44–1.00)
GFR, Estimated: >60 mL/min (ref >60)
Glucose, Bld: 102 mg/dL — ABNORMAL HIGH (ref 70–99)
Potassium: 4.3 mmol/L (ref 3.5–5.1)
Sodium: 139 mmol/L (ref 135–145)
Total Bilirubin: 0.4 mg/dL (ref 0.3–1.2)
Total Protein: 7.1 g/dL (ref 6.5–8.1)

## 2021-05-16 LAB — CBC WITH DIFFERENTIAL/PLATELET
Abs Immature Granulocytes: 0.01 10*3/uL (ref 0.00–0.07)
Basophils Absolute: 0.1 10*3/uL (ref 0.0–0.1)
Basophils Relative: 1 %
Eosinophils Absolute: 0.3 10*3/uL (ref 0.0–0.5)
Eosinophils Relative: 5 %
HCT: 39.3 % (ref 36.0–46.0)
Hemoglobin: 12.5 g/dL (ref 12.0–15.0)
Immature Granulocytes: 0 %
Lymphocytes Relative: 39 %
Lymphs Abs: 2.2 10*3/uL (ref 0.7–4.0)
MCH: 27.1 pg (ref 26.0–34.0)
MCHC: 31.8 g/dL (ref 30.0–36.0)
MCV: 85.2 fL (ref 80.0–100.0)
Monocytes Absolute: 0.4 10*3/uL (ref 0.1–1.0)
Monocytes Relative: 8 %
Neutro Abs: 2.5 10*3/uL (ref 1.7–7.7)
Neutrophils Relative %: 47 %
Platelets: 228 10*3/uL (ref 150–400)
RBC: 4.61 MIL/uL (ref 3.87–5.11)
RDW: 13.8 % (ref 11.5–15.5)
WBC: 5.5 10*3/uL (ref 4.0–10.5)
nRBC: 0 % (ref 0.0–0.2)

## 2021-05-16 MED ORDER — TAMOXIFEN CITRATE 20 MG PO TABS: 20.0000 mg | ORAL_TABLET | Freq: Every day | ORAL | 1 refills | Status: DC

## 2021-05-16 NOTE — Progress Notes (Signed)
?Hematology/Oncology progress note ?[place] ?[phone] [phone] ? ? ?Patient Care Team: ?Jearld Fenton, NP as PCP - General (Internal Medicine) ?Earlie Server, MD as Consulting Physician (Hematology and Oncology) ? ?REFERRING PROVIDER: ?Jearld Fenton, NP  ?CHIEF COMPLAINTS/REASON FOR VISIT:  ?Follow-up for management of right breast mucinous carcinoma ? ?HISTORY OF PRESENTING ILLNESS:  ? ?Annette Le is a  51 y.o.  female with PMH listed below was seen in consultation at the request of  Jearld Fenton, NP  for evaluation of breast cancer.  ? ?She felt a painful right breast nodule for about 6 weeks. Went to see primary care physician and mammogram was obtained.  ?06/17/2020 bilateral diagnostic mammogram showed 14 x 15 x 12 mm right upper outer breast mass, 11 o'clock 2cm from nipple. close to the site of palpable/painful concern. And 2 other probably benign mass at 12 o'clock.  ? ?06/22/2020 Right upper outer breast mass biopsy is positive for invasive mammary carcinoma with mucinous features.  Grade 2, DCIS not identified.  LVI not identified. ? ?Patient is scheduled to have additional ultrasound-guided biopsy of the 2 masses at 12 o'clock position of the right breast. ?Patient presents to establish care discussed.  She has appointment scheduled to see surgery with Dr.Rodenberg.  She was accompanied by her husband. ? ?Family history of breast cancer: Maternal aunt was diagnosed with breast cancer at the age of 74 ?Family history of other cancers: Maternal aunt stomach cancer, maternal grandfather pancreatic cancer, maternal aunt lung cancer-also a smoker. ? ?Menarche: 51 years of age ? premenopausal, LMP 06/22/2020 ?Number of pregnancies :  ?Age at first live childbirth: ?Used OCP: Remote use for a few months. ?Used estrogen and progesterone therapy: Denies ?History of Radiation to the chest: Denies ?Previous of breast biopsy: Denies ? ?06/29/2020, right breast  12:00 retroareolar ultrasound-guided biopsy showed negative for cancer ?07/08/2020, MRI breast bilateral with and without contrast showed known biopsy-proven malignancy over the upper outer quadrant of the right breast.  No additional suspicious masses or abdominal enhancement within either breast. ?07/25/2020 underwent right breast lumpectomy and sentinel lymph node biopsy ?Pathology showed mucinous carcinoma, 1.5 cm, grade 2, 4 sentinel lymph nodes were excised and 0 was involved with malignancy.  Margins were all negative.  pT1c pN0, Per previous biopsy, ER 90%, PR 90%, HER2 negative by immunohistochemistry ?Case was discussed on tumor board on 08/01/20 ?Consensus was reached to proceed with adjuvant radiation followed by antiestrogen treatments. ? ?Family history of cancer, genetic testing is negative.   VUS in POLE and TERT ? ?08/24/2020-09/14/2020 adjuvant radiation ?INTERVAL HISTORY ?Annette Le is a 51 y.o. female who has above history reviewed by me today presents for follow up visit for management of right breast cancer ?10/05/2020 started on Tamoxifen.  ?+ Hot flash, fatigue.+ Leg cramps. ? ? ?Review of Systems  ?Constitutional:  Negative for appetite change, chills, fatigue and fever.  ?HENT:   Negative for hearing loss and voice change.   ?Eyes:  Negative for eye problems.  ?Respiratory:  Negative for chest tightness and cough.   ?Cardiovascular:  Negative for chest pain.  ?Gastrointestinal:  Negative for abdominal distention, abdominal pain and blood in stool.  ?Endocrine: Positive for hot flashes.  ?Genitourinary:  Negative for difficulty urinating and frequency.   ?Musculoskeletal:  Negative for arthralgias and back pain.  ?Skin:  Negative for itching and rash.  ?Neurological:  Negative for extremity weakness.  ?Hematological:  Negative for adenopathy.  ?Psychiatric/Behavioral:  Negative for confusion.   ? ?MEDICAL HISTORY:  ?Past Medical History:  ?Diagnosis Date  ? Allergy   ? Asthma   ? Carpal  tunnel syndrome   ? Family history of breast cancer   ? Family history of colon cancer   ? Family history of pancreatic cancer   ? Family history of stomach cancer   ? Knee injury   ? Personal history of radiation therapy 08/03/2020  ? Breast  ? ? ?SURGICAL HISTORY: ?Past Surgical History:  ?Procedure Laterality Date  ? BREAST BIOPSY Right 06/22/2020  ? u/s bx @ 11:00-"vision" clip-INVASIVE MAMMARY CARCINOMA WITH MUCINOUS FEATURES.  ? BREAST BIOPSY Right 06/29/2020  ? [place] bx at 12:00, x marker, benign  ? BREAST LUMPECTOMY Right 07/15/2020  ? Mucinous carcinoma   NEG margins  ? BREAST LUMPECTOMY,RADIO FREQ LOCALIZER,AXILLARY SENTINEL LYMPH NODE BIOPSY Right 07/25/2020  ? Procedure: BREAST LUMPECTOMY,RADIO FREQ LOCALIZER,AXILLARY SENTINEL LYMPH NODE BIOPSY;  Surgeon: Ronny Bacon, MD;  Location: [facility] ORS;  Service: General;  Laterality: Right;  ? ? ?SOCIAL HISTORY: ?Social History  ? ?Socioeconomic History  ? Marital status: Married  ?  Spouse name: Not on file  ? Number of children: Not on file  ? Years of education: Not on file  ? Highest education level: Not on file  ?Occupational History  ? Not on file  ?Tobacco Use  ? Smoking status: Former  ?  Types: Cigarettes  ?  Quit date: 10/11/2003  ?  Years since quitting: 17.6  ? Smokeless tobacco: Never  ?Vaping Use  ? Vaping Use: Never used  ?Substance and Sexual Activity  ? Alcohol use: Yes  ?  Comment: occ  ? Drug use: Never  ? Sexual activity: Not on file  ?Other Topics Concern  ? Not on file  ?Social History Narrative  ? Not on file  ? ?Social Determinants of Health  ? ?Financial Resource Strain: Not on file  ?Food Insecurity: Not on file  ?Transportation Needs: Not on file  ?Physical Activity: Not on file  ?Stress: Not on file  ?Social Connections: Not on file  ?Intimate Partner Violence: Not on file  ? ? ?FAMILY HISTORY: ?Family History  ?Problem Relation Age of Onset  ? Colon cancer Maternal Aunt   ? Lung cancer Maternal Aunt   ? Stomach cancer Maternal Aunt    ? Cancer Father 58  ?     intestinal vs. stomach  ? Pancreatic cancer Maternal Grandfather   ?     d. 88s  ? Breast cancer Cousin   ? ? ?ALLERGIES:  is allergic to shrimp extract allergy skin test, ibuprofen, shrimp [shellfish allergy], tylenol [acetaminophen], and other. ? ?MEDICATIONS:  ?Current Outpatient Medications  ?Medication Sig Dispense Refill  ? Ascorbic Acid (VITAMIN C PO) Take by mouth.    ? Calcium Carb-Cholecalciferol (CALTRATE 600+D3) 600-800 MG-UNIT TABS Take by mouth.    ? Multiple Vitamin (MULTIVITAMIN) tablet Take 1 tablet by mouth daily.    ? VITAMIN E PO Take by mouth.    ? tamoxifen (NOLVADEX) 20 MG tablet Take 1 tablet (20 mg total) by mouth daily. 90 tablet 1  ? ?No current facility-administered medications for this visit.  ? ? ? ?PHYSICAL EXAMINATION: ?ECOG PERFORMANCE STATUS: 0 - Asymptomatic ?Vitals:  ? 05/16/21 1308  ?BP: 125/79  ?Pulse: 66  ?Temp: (!) 96.4 ?F (35.8 ?C)  ? ?Filed Weights  ? 05/16/21 1308  ?Weight: 148 lb (67.1 kg)  ? ? ?Physical Exam ?Constitutional:   ?  General: She is not in acute distress. ?HENT:  ?   Head: Normocephalic and atraumatic.  ?Eyes:  ?   General: No scleral icterus. ?Cardiovascular:  ?   Rate and Rhythm: Normal rate and regular rhythm.  ?   Heart sounds: Normal heart sounds.  ?Pulmonary:  ?   Effort: Pulmonary effort is normal. No respiratory distress.  ?   Breath sounds: No wheezing.  ?Abdominal:  ?   General: Bowel sounds are normal. There is no distension.  ?   Palpations: Abdomen is soft.  ?Musculoskeletal:     ?   General: No deformity. Normal range of motion.  ?   Cervical back: Normal range of motion and neck supple.  ?Skin: ?   General: Skin is warm and dry.  ?   Findings: No erythema or rash.  ?Neurological:  ?   Mental Status: She is alert and oriented to person, place, and time. Mental status is at baseline.  ?   Cranial Nerves: No cranial nerve deficit.  ?   Coordination: Coordination normal.  ?Psychiatric:     ?   Mood and Affect: Mood  normal.  ?Breast exam was performed in seated and lying down position. ?Patient is status post right breast lumpectomy with a well-healed surgical scar.  No palpable right breast mass.  No palpable left breast ma

## 2021-06-26 ENCOUNTER — Telehealth: Payer: Self-pay | Admitting: *Deleted

## 2021-06-26 ENCOUNTER — Other Ambulatory Visit: Payer: Self-pay

## 2021-06-26 DIAGNOSIS — C50411 Malignant neoplasm of upper-outer quadrant of right female breast: Secondary | ICD-10-CM

## 2021-06-26 NOTE — Telephone Encounter (Signed)
Patient called stating that she would like prescription for antidepressant discussed at her 3/28 appt ?

## 2021-06-26 NOTE — Telephone Encounter (Signed)
Call returned to patient and I got voice mail and left message to see if she is wanting Effexor for hot flashes per request of Dr Tasia Catchings ?

## 2021-06-27 ENCOUNTER — Other Ambulatory Visit: Payer: Self-pay | Admitting: Oncology

## 2021-06-27 MED ORDER — VENLAFAXINE HCL 37.5 MG PO TABS: 37.5000 mg | ORAL_TABLET | Freq: Every day | ORAL | 3 refills | Status: DC

## 2021-06-27 NOTE — Telephone Encounter (Signed)
Notified patient that prescription has been sent to pharmacy for her . She said she will pick it up today ?

## 2021-06-27 NOTE — Telephone Encounter (Addendum)
Patient called back and said she is asking for medicine for both depression and hot flashes. She said it was offered at her last appointment but she did not want it at that time. Now she wants it ?

## 2021-07-12 ENCOUNTER — Ambulatory Visit
Admission: RE | Admit: 2021-07-12 | Discharge: 2021-07-12 | Disposition: A | Discharge status: Home / Self Care | Payer: BC Managed Care – PPO | Source: Ambulatory Visit | Attending: Surgery | Admitting: Surgery

## 2021-07-12 DIAGNOSIS — C50411 Malignant neoplasm of upper-outer quadrant of right female breast: Secondary | ICD-10-CM

## 2021-07-12 DIAGNOSIS — R922 Inconclusive mammogram: Secondary | ICD-10-CM | POA: Diagnosis not present

## 2021-07-12 DIAGNOSIS — Z853 Personal history of malignant neoplasm of breast: Secondary | ICD-10-CM | POA: Diagnosis not present

## 2021-07-12 DIAGNOSIS — Z170 Estrogen receptor positive status [ER+]: Secondary | ICD-10-CM | POA: Diagnosis not present

## 2021-07-12 DIAGNOSIS — C50919 Malignant neoplasm of unspecified site of unspecified female breast: Secondary | ICD-10-CM

## 2021-07-12 NOTE — Progress Notes (Signed)
Received e-mail from patient stating she feels "unmotivated" and would like to talk to LCSW.   Referral made to [facility].  Attempted to call patient but no answer.  Send her an e-mail stating Maurice Small will reach out to her in the next couple of days.

## 2021-07-20 ENCOUNTER — Encounter: Payer: Self-pay | Admitting: Oncology

## 2021-07-25 ENCOUNTER — Other Ambulatory Visit: Payer: Self-pay

## 2021-07-25 DIAGNOSIS — Z7189 Other specified counseling: Secondary | ICD-10-CM

## 2021-07-25 DIAGNOSIS — C50919 Malignant neoplasm of unspecified site of unspecified female breast: Secondary | ICD-10-CM

## 2021-07-27 ENCOUNTER — Encounter: Payer: Self-pay | Admitting: Licensed Clinical Social Worker

## 2021-07-27 ENCOUNTER — Inpatient Hospital Stay: Payer: BC Managed Care – PPO | Attending: Oncology | Admitting: Licensed Clinical Social Worker

## 2021-07-27 DIAGNOSIS — C50919 Malignant neoplasm of unspecified site of unspecified female breast: Secondary | ICD-10-CM

## 2021-07-27 NOTE — Progress Notes (Signed)
[facility] [facility]  Initial Assessment   Annette Le is a 51 y.o. year old female contacted by phone. Clinical Social Work was referred by nurse navigator for assessment of psychosocial needs.   SDOH (Social Determinants of Health) assessments performed: Yes SDOH Interventions    Flowsheet Row Most Recent Value  SDOH Interventions   Food Insecurity Interventions Intervention Not Indicated  Financial Strain Interventions Intervention Not Indicated  Housing Interventions Intervention Not Indicated  Stress Interventions Provide Counseling  Transportation Interventions Intervention Not Indicated  Depression Interventions/Treatment  Counseling       SDOH Screenings   Alcohol Screen: Not on file  Depression (PHQ2-9): Low Risk  (08/10/2020)   Depression (PHQ2-9)    PHQ-2 Score: 0  Financial Resource Strain: Low Risk  (07/27/2021)   Overall Financial Resource Strain (CARDIA)    Difficulty of Paying Living Expenses: Not very hard  Food Insecurity: No Food Insecurity (07/27/2021)   Hunger Vital Sign    Worried About Running Out of Food in the Last Year: Never true    Ran Out of Food in the Last Year: Never true  Housing: Low Risk  (07/27/2021)   Housing    Last Housing Risk Score: 0  Physical Activity: Not on file  Social Connections: Not on file  Stress: Stress Concern Present (07/27/2021)   [place]    Feeling of Stress : To some extent  Tobacco Use: Medium Risk (05/16/2021)   Patient History    Smoking Tobacco Use: Former    Smokeless Tobacco Use: Never    Passive Exposure: Not on file  Transportation Needs: No Transportation Needs (07/27/2021)   PRAPARE - Hydrologist (Medical): No    Lack of Transportation (Non-Medical): No     Distress Screen completed: No     No data to display            Family/Social Information:  Housing Arrangement: patient lives with  spouse,  Annette Le, Annette Le (Spouse)  [phone]  Family members/support persons in your life? Family, Friends, Medical laboratory scientific officer, Social research officer, government, and Development worker, community concerns: no  Employment: Working full time at [employer].  Income source: Employment Financial concerns: No Type of concern: None Food access concerns: no Religious or spiritual practice: Not known Services Currently in place:  [employer]BS PPO  Coping/ Adjustment to diagnosis: Patient understands treatment plan and what happens next? yes Concerns about diagnosis and/or treatment: Losing my job and/or losing income, Overwhelmed by information, and Afraid of cancer Patient reported stressors: Work/ school, Depression, Anxiety/ nervousness, Adjusting to my illness, and Facing my mortality Hopes and/or priorities: return to university Patient enjoys  N/A Current coping skills/ strengths: Ability for insight , Capable of independent living , Armed forces logistics/support/administrative officer , Scientist, research (life sciences) , Motivation for treatment/growth , Physical Health , Supportive family/friends , and Work skills     SUMMARY: Current SDOH Barriers:  Limited social support and [place] Work Clinical Goal(s):  Patient will work with SW to address concerns related to anxiety, adjustment and depression  Interventions: Discussed common feeling and emotions when being diagnosed with cancer, and the importance of support during treatment Informed patient of the support team roles and support services at [facility] Provided [place] contact information and encouraged patient to call with any questions or concerns Provided patient with information about CSW role in patient care and available resources. Patient and CSW discussed counseling services and patient  schedule an in person appointment with CSW on 07/31/2021 @ 10:00AM   Follow Up Plan: Patient will contact CSW with any support or resource needs and CSW will see patient on  07/31/2021 Patient verbalizes understanding of plan: Yes    Creta Dorame, LCSW

## 2021-07-31 ENCOUNTER — Inpatient Hospital Stay: Payer: BC Managed Care – PPO | Admitting: Licensed Clinical Social Worker

## 2021-07-31 DIAGNOSIS — C50919 Malignant neoplasm of unspecified site of unspecified female breast: Secondary | ICD-10-CM

## 2021-07-31 NOTE — Progress Notes (Signed)
[place] CSW Progress Note  Holiday representative met with patient to discuss post-treatment adjustment concerns.  Patient reported. " I don't feel like doing anything. When I get home all I want to do is get in the recliner."  Patient stated she has increasingly felt this way since she finished radiation treatment and has resumed her normal schedule.  Patient did report she is afraid of the cancer coming back and she is trying to make lifestyle changes to minimize the likelihood it would return.  Her main concern the stressfulness of her job and feeling without motivation.  CSW and patient discussed different ways to increase energy, including regular physical activity and spending time in nature.  CSW and patient discussed speaking with the store manager, and requesting a modification of schedule to have a more consistent schedule, to help with sleep schedule and fatigue.  CSW recommended patient contact oncologist and let her know that she did not find the SNRI to be effective and ask for a recommendation.  CSW and patient discussed different side effects of SNRI/SSRIs.  CSW encouraged patient to discuss with the oncologist the side effects she felt when taking the medication.  CSW and patient discussed patient's decision to withdraw from school for the summer and her plans to return in the fall to complete her degree.  CSW and patient discussed different ways to address family members when explaining her physical/emotional symptoms since treatment and boundary setting.  Patient scheduled an appointment for 08/16/2021 @ 10:00AM    Adelene Amas, LCSW

## 2021-08-02 ENCOUNTER — Encounter: Payer: Self-pay | Admitting: *Deleted

## 2021-08-16 ENCOUNTER — Inpatient Hospital Stay: Payer: BC Managed Care – PPO | Admitting: Licensed Clinical Social Worker

## 2021-08-16 ENCOUNTER — Encounter: Payer: Self-pay | Admitting: Oncology

## 2021-08-16 DIAGNOSIS — C50919 Malignant neoplasm of unspecified site of unspecified female breast: Secondary | ICD-10-CM

## 2021-08-16 NOTE — Progress Notes (Signed)
[place] CSW Progress Note  Holiday representative met with patient to discuss adjustment concerns. Patient reports she is "feeling better since our last meeting."  Patient stated she got a new dog and is looking forward to walking the dog regularly to increase her physical activity. Patient reports she is still feeling tired and wants to sleep, but denies symptoms of depression. Patient stated, "I've thought about it and I don't feel depressed, but I do feel anxious."  Patient stated she has noticed she is more vocal about when she is disagreement with something/someone or when something bothers her.  Patient stated she is glad she feels more comfortable expressing herself but is concerned she is expressing it in a mean manner.  CSW and patient discuss different ways to recognize triggers that may upset her and to think about whether the situation is worth expending energy.  CSW and patient also discussed fear of reoccurrence and how a constant fear and anxiety related to that fear may contribute to the short temper and annoyance, where it wasn't an issue before diagnosis.  CSW and patient discussed different ways to express oneself when we are feeling upset or annoyed and how to temper those feelings to express ourselves in a calmer manner.  CSW and patient discuss acceptance of the possibility there may be an occurrence of cancer and how it relates to a general sense of not having control about these things happening.  Patient rescheduled appointment for July 13th at 10:00AM/    Adelene Amas, LCSW

## 2021-08-17 ENCOUNTER — Other Ambulatory Visit: Payer: Self-pay | Admitting: Oncology

## 2021-08-17 MED ORDER — GABAPENTIN 100 MG PO CAPS: 100.0000 mg | ORAL_CAPSULE | ORAL | 0 refills | Status: DC

## 2021-08-17 NOTE — Telephone Encounter (Signed)
Please advise 

## 2021-08-29 ENCOUNTER — Inpatient Hospital Stay: Payer: BC Managed Care – PPO | Attending: Oncology | Admitting: Licensed Clinical Social Worker

## 2021-08-29 DIAGNOSIS — C50919 Malignant neoplasm of unspecified site of unspecified female breast: Secondary | ICD-10-CM

## 2021-08-29 NOTE — Progress Notes (Signed)
[place] CSW Progress Note  Holiday representative met with patient to follow-up on adjustment concerns.  Patient reports she is using ACT and CBT skills and they are effective in reducing anxiety and symptoms of depression.  Patient spoke with Dr. Tasia Catchings and was abel to discuss medication changes for the SSRI.  Patient reports she is feeling much better. Since the medication change CSW and patient discussed patient returning to university and how to balance classes with work and she has decided to reduce the class load this semester.  Patient and CSW discussed boundary setting with spouse and daughter and the importance of physical activity.  Patient scheduled appointment for August 8th @ 11:00AM    Adelene Amas, LCSW

## 2021-09-14 ENCOUNTER — Other Ambulatory Visit: Payer: Self-pay | Admitting: Oncology

## 2021-09-26 ENCOUNTER — Ambulatory Visit: Payer: Self-pay | Admitting: Radiation Oncology

## 2021-09-26 ENCOUNTER — Inpatient Hospital Stay: Payer: BC Managed Care – PPO | Admitting: Licensed Clinical Social Worker

## 2021-09-29 ENCOUNTER — Inpatient Hospital Stay: Payer: BC Managed Care – PPO | Attending: Oncology | Admitting: Licensed Clinical Social Worker

## 2021-09-29 ENCOUNTER — Encounter: Payer: Self-pay | Admitting: Licensed Clinical Social Worker

## 2021-09-29 DIAGNOSIS — C50919 Malignant neoplasm of unspecified site of unspecified female breast: Secondary | ICD-10-CM

## 2021-09-29 NOTE — Progress Notes (Signed)
[place] CSW Counseling Note  Patient was referred by medical provider. Treatment type: Individual  Presenting Concerns: Patient and/or family reports the following symptoms/concerns: anxiety, depression, and stress Duration of problem: 6 months; Severity of problem: moderate   Orientation:oriented to person, place, time/date, situation, day of week, month of year, and year.   Affect: Appropriate Risk of harm to self or others: No plan to harm self or others  Patient and/or Family's Strengths/Protective Factors: Social and Emotional competence, Concrete supports in place (healthy food, safe environments, etc.), Sense of purpose, Physical Health (exercise, healthy diet, medication compliance, etc.), Parental Resilience, and familial supportsAbility for insight  Active sense of humor  Average or above average intelligence  Capable of independent living  Engineer, drilling fund of knowledge  Motivation for treatment/growth  Physical Health  Religious Affiliation  Special hobby/interest  Supportive family/friends  Work skills      Goals Addressed: Patient will:  Reduce symptoms of: anxiety, depression, and stress Increase knowledge and/or ability of: coping skills and stress reduction, communication and boundary setting Increase healthy adjustment to current life circumstances, Increase adequate support systems for patient/family, and increase boundary setting   Progress towards Goals: Progressing   Interventions: Interventions utilized:  CBT, Motivational Interviewing, Solution Focused, Strength-based, Assertiveness Training, Supportive, Family Systems, and Reframing      Assessment: Patient currently experiencing continued adjustment concerns post treatment.  Patient continues to feel fatigue and mentioned she is unable to do the same household things she would do before her diagnosis. Patient continues to work on reducing stress at work, setting  boundaries with co-workers and family, and communication.   Patient may benefit from continued counseling.   Plan: Follow up with CSW: in 4 weeks Behavioral recommendations: continue using skills to assist with adjustment concerns Referral(s): Other medical:  N/A       Adelene Amas, LCSW

## 2021-10-16 ENCOUNTER — Ambulatory Visit
Admission: RE | Admit: 2021-10-16 | Discharge: 2021-10-16 | Disposition: A | Discharge status: Home / Self Care | Payer: BC Managed Care – PPO | Source: Ambulatory Visit | Attending: Radiation Oncology | Admitting: Radiation Oncology

## 2021-10-16 VITALS — BP 116/90 | HR 84 | Temp 98.0°F | Wt 155.0 lb

## 2021-10-16 DIAGNOSIS — C50411 Malignant neoplasm of upper-outer quadrant of right female breast: Secondary | ICD-10-CM | POA: Diagnosis not present

## 2021-10-16 DIAGNOSIS — Z79810 Long term (current) use of selective estrogen receptor modulators (SERMs): Secondary | ICD-10-CM | POA: Diagnosis not present

## 2021-10-16 DIAGNOSIS — Z170 Estrogen receptor positive status [ER+]: Secondary | ICD-10-CM | POA: Insufficient documentation

## 2021-10-16 DIAGNOSIS — Z923 Personal history of irradiation: Secondary | ICD-10-CM | POA: Diagnosis not present

## 2021-10-16 NOTE — Progress Notes (Signed)
Radiation Oncology Follow up Note  Name: Annette Le   Date:   10/16/2021 MRN:  [id number] DOB: [birth year]    This 51 y.o. 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  Seen today in routine follow-up she is doing well specifically denies breast tenderness cough or bone pain..  She had been current mammograms back in May which I have reviewed were BI-RADS 2 benign.  She is current on tamoxifen tolerating that well without side effect.  COMPLICATIONS OF TREATMENT: none  FOLLOW UP COMPLIANCE: keeps appointments   PHYSICAL EXAM:  BP (!) 116/90   Pulse 84   Temp 98 F (36.7 C)   Wt 155 lb (70.3 kg)   BMI 28.35 kg/m  Lungs are clear to A&P cardiac examination essentially unremarkable with regular rate and rhythm. No dominant mass or nodularity is noted in either breast in 2 positions examined. Incision is well-healed. No axillary or supraclavicular adenopathy is appreciated. Cosmetic result is excellent.  Well-developed well-nourished patient in NAD. HEENT reveals PERLA, EOMI, discs not visualized.  Oral cavity is clear. No oral mucosal lesions are identified. Neck is clear without evidence of cervical or supraclavicular adenopathy. Lungs are clear to A&P. Cardiac examination is essentially unremarkable with regular rate and rhythm without murmur rub or thrill. Abdomen is benign with no organomegaly or masses noted. Motor sensory and DTR levels are equal and symmetric in the upper and lower extremities. Cranial nerves II through XII are grossly intact. Proprioception is intact. No peripheral adenopathy or edema is identified. No motor or  sensory levels are noted. Crude visual fields are within normal range.  RADIOLOGY RESULTS: Mammograms reviewed compatible with above-stated findings  PLAN: Present time patient is doing well with no evidence of disease 1 year out.  And pleased with her overall progress.  Of asked to see her back in 1 year for follow-up.  Patient continues on tamoxifen without side effect.  Patient knows to call with any concerns.  I would like to take this opportunity to thank you for allowing me to participate in the care of your patient.Noreene Filbert, MD

## 2021-10-22 DIAGNOSIS — Z111 Encounter for screening for respiratory tuberculosis: Secondary | ICD-10-CM | POA: Diagnosis not present

## 2021-10-25 DIAGNOSIS — Z681 Body mass index (BMI) 19 or less, adult: Secondary | ICD-10-CM | POA: Diagnosis not present

## 2021-10-25 DIAGNOSIS — Z0184 Encounter for antibody response examination: Secondary | ICD-10-CM | POA: Diagnosis not present

## 2021-10-27 ENCOUNTER — Other Ambulatory Visit: Payer: Self-pay | Admitting: Oncology

## 2021-10-30 ENCOUNTER — Inpatient Hospital Stay: Payer: BC Managed Care – PPO | Attending: Oncology | Admitting: Licensed Clinical Social Worker

## 2021-10-30 DIAGNOSIS — Z803 Family history of malignant neoplasm of breast: Secondary | ICD-10-CM | POA: Insufficient documentation

## 2021-10-30 DIAGNOSIS — Z170 Estrogen receptor positive status [ER+]: Secondary | ICD-10-CM | POA: Insufficient documentation

## 2021-10-30 DIAGNOSIS — Z79810 Long term (current) use of selective estrogen receptor modulators (SERMs): Secondary | ICD-10-CM | POA: Insufficient documentation

## 2021-10-30 DIAGNOSIS — Z801 Family history of malignant neoplasm of trachea, bronchus and lung: Secondary | ICD-10-CM | POA: Insufficient documentation

## 2021-10-30 DIAGNOSIS — Z87891 Personal history of nicotine dependence: Secondary | ICD-10-CM | POA: Insufficient documentation

## 2021-10-30 DIAGNOSIS — C50411 Malignant neoplasm of upper-outer quadrant of right female breast: Secondary | ICD-10-CM | POA: Insufficient documentation

## 2021-10-30 DIAGNOSIS — Z800 Family history of malignant neoplasm of digestive organs: Secondary | ICD-10-CM | POA: Insufficient documentation

## 2021-11-01 ENCOUNTER — Ambulatory Visit (LOCAL_COMMUNITY_HEALTH_CENTER): Payer: Self-pay

## 2021-11-01 ENCOUNTER — Ambulatory Visit: Payer: BC Managed Care – PPO

## 2021-11-01 ENCOUNTER — Other Ambulatory Visit: Payer: Self-pay | Admitting: Surgery

## 2021-11-01 VITALS — Wt 156.0 lb

## 2021-11-01 DIAGNOSIS — R7612 Nonspecific reaction to cell mediated immunity measurement of gamma interferon antigen response without active tuberculosis: Secondary | ICD-10-CM

## 2021-11-01 NOTE — Progress Notes (Signed)
Patient referred to [facility] from [facility] due to +QFT on 10/25/2021. Patient reports she initially had a positive PPD on 10/22/2021 and then had a +QFT on 10/25/2021.   Patient explained she applied for a Visa in 2017 and at that time had a PPD which was negative. Daughter's was positive. Patient reports she also had a chest x-ray at that time which doctors prescribed medication for her to take for 6 months from February 2017 to July 2017. Patient does not know the name of the drug, but thinks it was given to her due to something on her CXR. After completing medication she had a repeat CXR which she reports was fine.Patient then travelled to the [place] with daughter in July 2017.   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 She would travel back to the [place] to pick up daughter and then travel to the [place] and then back to [place] and [place] and repeated this cycle for the next two years until quitting her job in 2019. She reports she has not travelled since September 18, 2017.   Patient is being followed by oncologist for breast CA. Taking Tamoxifen. Also, has a history of asthma. Does not smoke and has 1-2 glasses of wine a month.   Discussed the differences of latent vs active TB. Patient not sure if she was treated in the past for LTBI. Explained LTBI treatment consists of a 4 month commitment taking two capsules a day. Explained it was personal choice and free of charge. Patient questioned if she could work with an elderly couple with latent TB. Explained she has no symptoms and if she has latent TB she is not contagious.  Patient to get CXR.  Servando Salina, RN

## 2021-11-01 NOTE — Progress Notes (Addendum)
Addendum: In addition to previous note, Chest x-ray req faxed to [facility]. Instructions given verbally over phone to patient to go to [place] entrance and not the ER entrance. Patient will go to get chest x-ray in morning.Requisition copy and order given to Roxy.  Bill both radiology services and radiology reading to [facility] Department. Servando Salina, RN

## 2021-11-03 ENCOUNTER — Ambulatory Visit
Admission: RE | Admit: 2021-11-03 | Discharge: 2021-11-03 | Disposition: A | Discharge status: Home / Self Care | Payer: BC Managed Care – PPO | Source: Ambulatory Visit | Attending: Surgery | Admitting: Surgery

## 2021-11-03 ENCOUNTER — Ambulatory Visit
Admission: RE | Admit: 2021-11-03 | Discharge: 2021-11-03 | Disposition: A | Discharge status: Home / Self Care | Payer: BC Managed Care – PPO | Attending: Surgery | Admitting: Surgery

## 2021-11-03 DIAGNOSIS — R7612 Nonspecific reaction to cell mediated immunity measurement of gamma interferon antigen response without active tuberculosis: Secondary | ICD-10-CM

## 2021-11-06 ENCOUNTER — Telehealth: Payer: Self-pay

## 2021-11-06 NOTE — Telephone Encounter (Signed)
Call to patient to f/u on CXR results and to find out if she has decided on LTBI treatment. No answer. LMOM with call back number. Servando Salina, RN

## 2021-11-07 ENCOUNTER — Telehealth: Payer: Self-pay

## 2021-11-07 NOTE — Telephone Encounter (Signed)
Call to patient to discuss CXR results and whether patient wants to start LTBI treatment. LMOM with call back number. Servando Salina, RN

## 2021-11-07 NOTE — Telephone Encounter (Signed)
Patient returned call. Informed her that CXR was normal. Informed patient can start LTBI treatment if she would like. Reminded her that it is a 4 month commitment taking 2 capsules a day. Patient would like to start treatment. Scheduled appointment to start treament with Rifampin on 11/14/2021 at 11:10 AM. Servando Salina, RN

## 2021-11-08 NOTE — Progress Notes (Signed)
Encounter reviewed. Aileen Fass, RN

## 2021-11-10 ENCOUNTER — Other Ambulatory Visit: Payer: Self-pay | Admitting: Surgery

## 2021-11-10 DIAGNOSIS — R7612 Nonspecific reaction to cell mediated immunity measurement of gamma interferon antigen response without active tuberculosis: Secondary | ICD-10-CM

## 2021-11-10 NOTE — Progress Notes (Signed)
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   Diagnosis of LTBI and pertinent labs/info: +QFT: 10/25/2021 CXR: negative 11/03/2021 EPI: 11/01/2021  Therapy: Rifampin '600mg'$  daily for 4 months LTBI therapy start date: 11/14/2021  PCP: Webb Silversmith NP  Tuberculosis treatment orders  All patients are to be monitored per [place] and county TB policies.   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 Treat for latent TB per the following:  Rifampin '600mg'$  daily by mouth x 4 months per standing orders ([place]) per Dr. Lolita Lenz.   Offer HIV and syphilis.  Obtain CBC and LFTs at start appt for baseline.   If baseline CBC and LFTs are normal, will only needs labs at subsequent visits if concerning symptoms arise, new potentially hepatotoxic medications, or significant alcohol consumption is reported that would require monitoring.  Leigh Aurora, MD

## 2021-11-13 ENCOUNTER — Telehealth: Payer: Self-pay

## 2021-11-13 NOTE — Telephone Encounter (Signed)
Call to Dr Collie Siad office regarding treatment for LTBI. Left message for triage nurse to call [facility] back. Servando Salina, RN

## 2021-11-13 NOTE — Telephone Encounter (Unsigned)
Call to patient to reschedule office visit scheduled tomorrow for LTBI treatment. No answer. Left voicemail message explaining rescheduling of appointment and [facility] will be contacting oncologist prior to starting treatment for LTBI treatment.  Servando Salina, RN

## 2021-11-14 ENCOUNTER — Telehealth: Payer: Self-pay | Admitting: *Deleted

## 2021-11-14 ENCOUNTER — Telehealth: Payer: Self-pay | Admitting: Surgery

## 2021-11-14 ENCOUNTER — Other Ambulatory Visit: Payer: Self-pay

## 2021-11-14 DIAGNOSIS — R7612 Nonspecific reaction to cell mediated immunity measurement of gamma interferon antigen response without active tuberculosis: Secondary | ICD-10-CM

## 2021-11-14 DIAGNOSIS — C50919 Malignant neoplasm of unspecified site of unspecified female breast: Secondary | ICD-10-CM

## 2021-11-14 NOTE — Telephone Encounter (Signed)
Of note, patient reported during her EPI screening that in the [place] she tested negative for TB but had a suspicious CXR and was placed on medication for 6 months.   Unfortunately, the patient does not remember what medication she took although that does sound like a typical course of Isoniazid for latent TB tx. She was encouraged to obtain her medical records from this treatment but insisted she simply could not get them from back in the [place].   The patient does not need to treated for latent TB twice, but unfortunately we cannot confirm she had tx for latent TB in the past.   Treatment for latent TB is optional. After the patient discusses tx options with her oncology doctor we will touch base with her again to determine if she would still like to undergo tx for latent TB with Rifampin 600 mg for 4 months.  Annette Aurora, MD

## 2021-11-14 NOTE — Telephone Encounter (Signed)
Dr Lolita Lenz called back and states he got my message regarding this patient. She said that there is more to her story though, She is from the [place] and had tested negative for TB, but had an unusual CXR and took a medicine that she does not recall the name of for TB for 6 months. She states that patient said she cannot get her records from there and that it sounds like maybe she took Isoniazid and that Dr Lolita Lenz is going to put a note in EPIC for Dr Tasia Catchings to see She mentioned that she does not want to treat patient for a second time for latent TB. She left her cell number if you need to call her directly [phone]  Dr Louretta Parma note: Of note, patient reported during her EPI screening that in the [place] she tested negative for TB but had a suspicious CXR and was placed on medication for 6 months.    Unfortunately, the patient does not remember what medication she took although that does sound like a typical course of Isoniazid for latent TB tx. She was encouraged to obtain her medical records from this treatment but insisted she simply could not get them from back in the [place].    The patient does not need to treated for latent TB twice, but unfortunately we cannot confirm she had tx for latent TB in the past.    Treatment for latent TB is optional. After the patient discusses tx options with her oncology doctor we will touch base with her again to determine if she would still like to undergo tx for latent TB with Rifampin 600 mg for 4 months.   Leigh Aurora, MD

## 2021-11-14 NOTE — Telephone Encounter (Signed)
We had contacted Dr. Tasia Catchings by Epic message/office phone about pt's upcoming tx for LTBI with Rifampin and if they needed to alter the pt's chemotherapy for breast cancer w/tamoxifen. Office called back and they will discuss changes w/pt in clinic this week.   Dr Tasia Catchings will take patient off of Tamoxifen if LTBI tx w/Rifampin is started. She will discuss alternatives with the patient in clinic this week.  Isoniazid will also interact with her Tamoxifen, so if she proceeds with LTBI tx, will need different chemotherapy regimen.  LTBI tx start TBD, follow up w/patient/Dr. Tasia Catchings next week.  Leigh Aurora, MD

## 2021-11-16 ENCOUNTER — Inpatient Hospital Stay (HOSPITAL_BASED_OUTPATIENT_CLINIC_OR_DEPARTMENT_OTHER): Payer: BC Managed Care – PPO | Admitting: Oncology

## 2021-11-16 ENCOUNTER — Encounter: Payer: Self-pay | Admitting: Oncology

## 2021-11-16 ENCOUNTER — Inpatient Hospital Stay: Payer: BC Managed Care – PPO

## 2021-11-16 VITALS — BP 111/81 | HR 78 | Temp 99.4°F | Resp 18 | Wt 152.8 lb

## 2021-11-16 DIAGNOSIS — Z803 Family history of malignant neoplasm of breast: Secondary | ICD-10-CM | POA: Diagnosis not present

## 2021-11-16 DIAGNOSIS — C50919 Malignant neoplasm of unspecified site of unspecified female breast: Secondary | ICD-10-CM

## 2021-11-16 DIAGNOSIS — C50411 Malignant neoplasm of upper-outer quadrant of right female breast: Secondary | ICD-10-CM | POA: Diagnosis not present

## 2021-11-16 DIAGNOSIS — Z79810 Long term (current) use of selective estrogen receptor modulators (SERMs): Secondary | ICD-10-CM

## 2021-11-16 DIAGNOSIS — Z227 Latent tuberculosis: Secondary | ICD-10-CM | POA: Diagnosis not present

## 2021-11-16 DIAGNOSIS — Z800 Family history of malignant neoplasm of digestive organs: Secondary | ICD-10-CM | POA: Diagnosis not present

## 2021-11-16 DIAGNOSIS — Z170 Estrogen receptor positive status [ER+]: Secondary | ICD-10-CM | POA: Diagnosis not present

## 2021-11-16 DIAGNOSIS — Z801 Family history of malignant neoplasm of trachea, bronchus and lung: Secondary | ICD-10-CM | POA: Diagnosis not present

## 2021-11-16 DIAGNOSIS — Z87891 Personal history of nicotine dependence: Secondary | ICD-10-CM | POA: Diagnosis not present

## 2021-11-16 LAB — COMPREHENSIVE METABOLIC PANEL
ALT: 37 U/L (ref 0–44)
AST: 41 U/L (ref 15–41)
Albumin: 3.7 g/dL (ref 3.5–5.0)
Alkaline Phosphatase: 60 U/L (ref 38–126)
Anion gap: 6 (ref 5–15)
BUN: 12 mg/dL (ref 6–20)
CO2: 22 mmol/L (ref 22–32)
Calcium: 8.4 mg/dL — ABNORMAL LOW (ref 8.9–10.3)
Chloride: 108 mmol/L (ref 98–111)
Creatinine, Ser: 0.79 mg/dL (ref 0.44–1.00)
GFR, Estimated: >60 mL/min (ref >60)
Glucose, Bld: 226 mg/dL — ABNORMAL HIGH (ref 70–99)
Potassium: 3.9 mmol/L (ref 3.5–5.1)
Sodium: 136 mmol/L (ref 135–145)
Total Bilirubin: 0.4 mg/dL (ref 0.3–1.2)
Total Protein: 7.1 g/dL (ref 6.5–8.1)

## 2021-11-16 LAB — CBC WITH DIFFERENTIAL/PLATELET
Abs Immature Granulocytes: 0 10*3/uL (ref 0.00–0.07)
Basophils Absolute: 0 10*3/uL (ref 0.0–0.1)
Basophils Relative: 1 %
Eosinophils Absolute: 0.3 10*3/uL (ref 0.0–0.5)
Eosinophils Relative: 6 %
HCT: 39.6 % (ref 36.0–46.0)
Hemoglobin: 13.1 g/dL (ref 12.0–15.0)
Immature Granulocytes: 0 %
Lymphocytes Relative: 28 %
Lymphs Abs: 1.5 10*3/uL (ref 0.7–4.0)
MCH: 28.4 pg (ref 26.0–34.0)
MCHC: 33.1 g/dL (ref 30.0–36.0)
MCV: 85.7 fL (ref 80.0–100.0)
Monocytes Absolute: 0.3 10*3/uL (ref 0.1–1.0)
Monocytes Relative: 6 %
Neutro Abs: 3 10*3/uL (ref 1.7–7.7)
Neutrophils Relative %: 59 %
Platelets: 236 10*3/uL (ref 150–400)
RBC: 4.62 MIL/uL (ref 3.87–5.11)
RDW: 12.5 % (ref 11.5–15.5)
WBC: 5.1 10*3/uL (ref 4.0–10.5)
nRBC: 0 % (ref 0.0–0.2)

## 2021-11-16 NOTE — Assessment & Plan Note (Signed)
Recommend calcium and vitamin D

## 2021-11-16 NOTE — Assessment & Plan Note (Addendum)
Right breast Stage 1A mucinous carcinoma. Case was discussed on tumor board on 08/01/2020, per Dr. Reuel Derby, this is a few mucinous carcinoma, favorable histology.Status post adjuvant radiation Labs reviewed and discussed with Continue adjuvant tamoxifen 20 mg daily. Recommend annual pelvic examination by gynecologist - refer to gyn  If she decides to get Rifampin for treatment of TB,i will switch to ovarian suppression + AI. Patient does not want to take TB treatment currently and prefers to stay on Tamoxifen

## 2021-11-16 NOTE — Progress Notes (Signed)
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 Case was discussed on tumor board on 08/01/2020, per Dr. Reuel Derby, this is a few mucinous carcinoma, favorable histology.Status post adjuvant radiation Labs reviewed and discussed with Continue adjuvant tamoxifen 20 mg daily. Recommend annual pelvic examination by gynecologist - refer to gyn  If she decides to get Rifampin for treatment of TB,i will switch to ovarian suppression + AI. 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  Recommend patient to continue follow up with Dr.Nunez  Orders Placed This Encounter  Procedures   CBC with Differential/Platelet    Standing Status:   Future    Standing Expiration Date:   11/17/2022   Comprehensive metabolic panel    Standing Status:   Future    Standing Expiration Date:   11/16/2022   Ambulatory referral to Gynecology    Referral Priority:   Routine    Referral Type:   Consultation    Referral Reason:   Specialty Services Required    Requested Specialty:   Gynecology    Number of Visits Requested:   1   Follow up in 6 months All questions were answered. The patient knows to call the clinic with any problems, questions or concerns.  Earlie Server, MD, PhD [facility] Hematology Oncology 11/16/2021   CHIEF COMPLAINTS/REASON FOR VISIT:  Follow-up for management of right breast mucinous carcinoma  HISTORY OF PRESENTING ILLNESS:   Annette Le is a  51 y.o.  female with PMH listed below was seen in consultation at the request of  Jearld Fenton, NP  for evaluation of breast  cancer.   She felt a painful right breast nodule for about 6 weeks. Went to see primary care physician and mammogram was obtained.  06/17/2020 bilateral diagnostic mammogram showed 14 x 15 x 12 mm right upper outer breast mass, 11 o'clock 2cm from nipple. close to the site of palpable/painful concern. And 2 other probably benign mass at 12 o'clock.   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  Grade 2, DCIS not identified.  LVI not identified.  Patient is scheduled to have additional ultrasound-guided biopsy of the 2 masses at 12 o'clock position of the right breast. Patient presents to establish care discussed.  She has appointment scheduled to see surgery with Dr.Rodenberg.  She was accompanied by her husband.  Family history of breast cancer: Maternal aunt was diagnosed with breast cancer at the age of 83 Family history of other cancers: Maternal aunt stomach cancer, maternal grandfather pancreatic cancer, maternal aunt lung cancer-also a smoker.  Menarche: 51 years of age  premenopausal, LMP 06/22/2020 Number of pregnancies :  Age at first live childbirth: Used OCP: Remote use for a few months. Used estrogen and progesterone therapy: Denies History of Radiation to the chest: Denies Previous of breast biopsy: Denies  06/29/2020, right breast 12:00 retroareolar ultrasound-guided biopsy showed negative for cancer 07/08/2020, MRI breast bilateral with and without contrast showed known biopsy-proven malignancy over the upper outer quadrant of the right breast.  No additional suspicious masses or  abdominal enhancement within either breast. 07/25/2020 underwent right breast lumpectomy and sentinel lymph node biopsy Pathology showed mucinous carcinoma, 1.5 cm, grade 2, 4 sentinel lymph nodes were excised and 0 was involved with malignancy.  Margins were all negative.  pT1c pN0, Per previous biopsy, ER 90%, PR 90%, HER2 negative by  immunohistochemistry Case was discussed on tumor board on 08/01/20 Consensus was reached to proceed with adjuvant radiation followed by antiestrogen treatments.  Family history of cancer, genetic testing is negative.   VUS in POLE and TERT  08/24/2020-09/14/2020 adjuvant radiation INTERVAL HISTORY Annette Le is a 51 y.o. female who has above history reviewed by me today presents for follow up visit for management of right breast cancer 10/05/2020 started on Tamoxifen. Overall she tolerates well.  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  Per Dr.Nunez, she reports possibly being treated for latent TB in [place], and she took medication for 6 months. She was not able to remember the name of the medication. Dr.Nunez feels that she does not need to be treated for latent TB for twice, and treatment is optional.     Review of Systems  Constitutional:  Negative for appetite change, chills, fatigue and fever.  HENT:   Negative for hearing loss and voice change.   Eyes:  Negative for eye problems.  Respiratory:  Negative for chest tightness and cough.   Cardiovascular:  Negative for chest pain.  Gastrointestinal:  Negative for abdominal distention, abdominal pain and blood in stool.  Endocrine: Positive for hot flashes.  Genitourinary:  Negative for difficulty urinating and frequency.   Musculoskeletal:  Negative for arthralgias and back pain.  Skin:  Negative for itching and rash.  Neurological:  Negative for extremity weakness.  Hematological:  Negative for adenopathy.  Psychiatric/Behavioral:  Negative for confusion.     MEDICAL HISTORY:  Past Medical History:  Diagnosis Date   Allergy    Asthma    Carpal tunnel syndrome    Family history of breast cancer    Family history of colon cancer    Family history of pancreatic cancer    Family history of stomach cancer    Knee injury    Personal history of radiation therapy 08/03/2020   Breast     SURGICAL HISTORY: Past Surgical History:  Procedure Laterality Date   BREAST BIOPSY Right 06/22/2020   u/s bx @ 11:00-"vision" clip-INVASIVE MAMMARY CARCINOMA WITH MUCINOUS FEATURES.   BREAST BIOPSY Right 06/29/2020   [place] bx at 12:00, x marker, benign   BREAST LUMPECTOMY Right 07/15/2020   Mucinous carcinoma   NEG margins   BREAST LUMPECTOMY,RADIO FREQ LOCALIZER,AXILLARY SENTINEL LYMPH NODE BIOPSY Right 07/25/2020   Procedure: BREAST LUMPECTOMY,RADIO FREQ LOCALIZER,AXILLARY SENTINEL LYMPH NODE BIOPSY;  Surgeon: Ronny Bacon, MD;  Location: [facility] ORS;  Service: General;  Laterality: Right;    SOCIAL HISTORY: Social History   Socioeconomic History   Marital status: Married    Spouse name: Not on file   Number of children: Not on file   Years of education: Not on file   Highest education level: Not on file  Occupational History   Not on file  Tobacco Use   Smoking status: Former    Types: Cigarettes    Quit date: 10/11/2003    Years since quitting: 18.1   Smokeless tobacco: Never  Vaping Use   Vaping Use: Never used  Substance and Sexual Activity   Alcohol use: Yes    Comment: occ  Drug use: Never   Sexual activity: Not on file  Other Topics Concern   Not on file  Social History Narrative   Not on file   Social Determinants of Health   Financial Resource Strain: Low Risk  (07/27/2021)   Overall Financial Resource Strain (CARDIA)    Difficulty of Paying Living Expenses: Not very hard  Food Insecurity: No Food Insecurity (07/27/2021)   Hunger Vital Sign    Worried About Running Out of Food in the Last Year: Never true    Ran Out of Food in the Last Year: Never true  Transportation Needs: No Transportation Needs (07/27/2021)   PRAPARE - Hydrologist (Medical): No    Lack of Transportation (Non-Medical): No  Physical Activity: Not on file  Stress: Stress Concern Present (07/27/2021)   [place]    Feeling of Stress : To some extent  Social Connections: Not on file  Intimate Partner Violence: Not on file    FAMILY HISTORY: Family History  Problem Relation Age of Onset   Colon cancer Maternal Aunt    Lung cancer Maternal Aunt    Stomach cancer Maternal Aunt    Cancer Father 35       intestinal vs. stomach   Pancreatic cancer Maternal Grandfather        d. 47s   Breast cancer Cousin     ALLERGIES:  is allergic to shrimp extract allergy skin test, ibuprofen, shrimp [shellfish allergy], tylenol [acetaminophen], and other.  MEDICATIONS:  Current Outpatient Medications  Medication Sig Dispense Refill   Calcium Carb-Cholecalciferol (CALTRATE 600+D3) 600-800 MG-UNIT TABS Take by mouth.     tamoxifen (NOLVADEX) 20 MG tablet TAKE 1 TABLET DAILY 90 tablet 3   VITAMIN E PO Take by mouth.     No current facility-administered medications for this visit.     PHYSICAL EXAMINATION: ECOG PERFORMANCE STATUS: 0 - Asymptomatic Vitals:   11/16/21 1339  BP: 111/81  Pulse: 78  Resp: 18  Temp: 99.4 F (37.4 C)   Filed Weights   11/16/21 1339  Weight: 152 lb 12.8 oz (69.3 kg)    Physical Exam Constitutional:      General: She is not in acute distress. HENT:     Head: Normocephalic and atraumatic.  Eyes:     General: No scleral icterus. Cardiovascular:     Rate and Rhythm: Normal rate and regular rhythm.     Heart sounds: Normal heart sounds.  Pulmonary:     Effort: Pulmonary effort is normal. No respiratory distress.     Breath sounds: No wheezing.  Abdominal:     General: Bowel sounds are normal. There is no distension.     Palpations: Abdomen is soft.  Musculoskeletal:        General: No deformity. Normal range of motion.     Cervical back: Normal range of motion and neck supple.  Skin:    General: Skin is warm and dry.     Findings: No erythema or rash.  Neurological:     Mental Status: She is alert and oriented to person, place, and time.  Mental status is at baseline.     Cranial Nerves: No cranial nerve deficit.     Coordination: Coordination normal.  Psychiatric:        Mood and Affect: Mood normal.   Breast exam was performed in seated and lying down position. Patient is status post right breast lumpectomy with  a well-healed surgical scar.  No palpable right breast mass.  No palpable left breast mass.  No palpable axillary lymphadenopathy bilaterally.  LABORATORY DATA:  I have reviewed the data as listed    Latest Ref Rng & Units 11/16/2021    1:26 PM 05/16/2021   12:57 PM 11/16/2020    1:55 PM  CBC  WBC 4.0 - 10.5 K/uL 5.1  5.5  5.1   Hemoglobin 12.0 - 15.0 g/dL 13.1  12.5  13.7   Hematocrit 36.0 - 46.0 % 39.6  39.3  41.9   Platelets 150 - 400 K/uL 236  228  144       Latest Ref Rng & Units 11/16/2021    1:26 PM 05/16/2021   12:57 PM 11/16/2020    1:55 PM  CMP  Glucose 70 - 99 mg/dL 226  102  156   BUN 6 - 20 mg/dL _0 Creatinine 0.44 - 1.00 mg/dL 0.79  0.67  0.73   Sodium 135 - 145 mmol/L 136  139  136   Potassium 3.5 - 5.1 mmol/L 3.9  4.3  4.2   Chloride 98 - 111 mmol/L 108  108  106   CO2 22 - 32 mmol/L _1 Calcium 8.9 - 10.3 mg/dL 8.4  8.6  8.4   Total Protein 6.5 - 8.1 g/dL 7.1  7.1  7.0   Total Bilirubin 0.3 - 1.2 mg/dL 0.4  0.4  0.7   Alkaline Phos 38 - 126 U/L 60  58  68   AST 15 - 41 U/L 41  36  62   ALT 0 - 44 U/L 37  49  73      RADIOGRAPHIC STUDIES: I have personally reviewed the radiological images as listed and agreed with the findings in the report. DG Chest 1 View  Result Date: 11/04/2021 CLINICAL DATA:  Positive QuantiFERON-TB Gold test EXAM: CHEST  1 VIEW COMPARISON:  None Available. FINDINGS: The heart size and mediastinal contours are within normal limits. Both lungs are clear. The visualized skeletal structures are unremarkable. IMPRESSION: No active disease. Electronically Signed   By: Jerilynn Mages.  Shick M.D.   On: 11/04/2021 10:10

## 2021-11-16 NOTE — Progress Notes (Signed)
Pt here for follow up. Pt reports that she had an xray that showed latent TB and needs to be treated, but would like to discuss with Dr. Tasia Catchings before starting any treatment for TB.

## 2021-11-16 NOTE — Assessment & Plan Note (Signed)
CXR showed no active disease.  Recommend patient to continue follow up with Dr.Nunez

## 2021-11-17 ENCOUNTER — Telehealth: Payer: Self-pay | Admitting: Surgery

## 2021-11-17 DIAGNOSIS — R7612 Nonspecific reaction to cell mediated immunity measurement of gamma interferon antigen response without active tuberculosis: Secondary | ICD-10-CM

## 2021-11-17 NOTE — Telephone Encounter (Signed)
Pt declined tx for LTBI at this time.   Informed pt she can get TB Screens in lieu of TB testing in the future. Also told pt will scan in an LTBI work/school letter into her records if she needs it in the future.  Closed to TB follow up  Annette Aurora, MD

## 2021-11-18 LAB — FOLLICLE STIMULATING HORMONE: FSH: 9 m[IU]/mL

## 2021-11-18 LAB — ESTRADIOL: Estradiol: 136 pg/mL

## 2021-11-30 ENCOUNTER — Telehealth: Payer: Self-pay

## 2021-11-30 NOTE — Telephone Encounter (Signed)
Called patient on Wednesday, waiting on call back

## 2021-12-01 NOTE — Telephone Encounter (Signed)
Mychart message sent to pt to let her know to call [facility] gyn and set up appt.

## 2021-12-04 DIAGNOSIS — Z1211 Encounter for screening for malignant neoplasm of colon: Secondary | ICD-10-CM | POA: Diagnosis not present

## 2021-12-04 DIAGNOSIS — Z01419 Encounter for gynecological examination (general) (routine) without abnormal findings: Secondary | ICD-10-CM | POA: Diagnosis not present

## 2021-12-04 DIAGNOSIS — Z124 Encounter for screening for malignant neoplasm of cervix: Secondary | ICD-10-CM | POA: Diagnosis not present

## 2021-12-04 DIAGNOSIS — Z118 Encounter for screening for other infectious and parasitic diseases: Secondary | ICD-10-CM | POA: Diagnosis not present

## 2021-12-04 DIAGNOSIS — Z79810 Long term (current) use of selective estrogen receptor modulators (SERMs): Secondary | ICD-10-CM | POA: Diagnosis not present

## 2021-12-13 ENCOUNTER — Ambulatory Visit: Payer: BC Managed Care – PPO

## 2021-12-19 DIAGNOSIS — R87611 Atypical squamous cells cannot exclude high grade squamous intraepithelial lesion on cytologic smear of cervix (ASC-H): Secondary | ICD-10-CM | POA: Diagnosis not present

## 2021-12-19 DIAGNOSIS — B977 Papillomavirus as the cause of diseases classified elsewhere: Secondary | ICD-10-CM | POA: Diagnosis not present

## 2021-12-19 DIAGNOSIS — N852 Hypertrophy of uterus: Secondary | ICD-10-CM | POA: Diagnosis not present

## 2022-03-12 ENCOUNTER — Encounter: Payer: Self-pay | Admitting: Oncology

## 2022-03-12 ENCOUNTER — Telehealth: Payer: Self-pay | Admitting: *Deleted

## 2022-03-12 NOTE — Telephone Encounter (Signed)
Call returned to patient and she will call her PCP office to see if they can test her or if she will need to go to an urgent care clinic. I asked that she let us know the results and she agreed to let us know

## 2022-03-12 NOTE — Telephone Encounter (Signed)
Patient called reporting that she has fever and the flu and wants to check with Dr Tasia Catchings if it ok to take medicine for it. Please advise

## 2022-05-17 ENCOUNTER — Inpatient Hospital Stay: Payer: BC Managed Care – PPO | Attending: Oncology

## 2022-05-17 ENCOUNTER — Encounter: Payer: Self-pay | Admitting: Oncology

## 2022-05-17 ENCOUNTER — Inpatient Hospital Stay (HOSPITAL_BASED_OUTPATIENT_CLINIC_OR_DEPARTMENT_OTHER): Payer: BC Managed Care – PPO | Admitting: Oncology

## 2022-05-17 VITALS — BP 117/86 | HR 73 | Temp 97.7°F | Resp 18 | Wt 153.5 lb

## 2022-05-17 DIAGNOSIS — Z170 Estrogen receptor positive status [ER+]: Secondary | ICD-10-CM | POA: Diagnosis not present

## 2022-05-17 DIAGNOSIS — C50411 Malignant neoplasm of upper-outer quadrant of right female breast: Secondary | ICD-10-CM | POA: Insufficient documentation

## 2022-05-17 DIAGNOSIS — D509 Iron deficiency anemia, unspecified: Secondary | ICD-10-CM | POA: Insufficient documentation

## 2022-05-17 DIAGNOSIS — D508 Other iron deficiency anemias: Secondary | ICD-10-CM

## 2022-05-17 DIAGNOSIS — C50919 Malignant neoplasm of unspecified site of unspecified female breast: Secondary | ICD-10-CM

## 2022-05-17 DIAGNOSIS — Z79810 Long term (current) use of selective estrogen receptor modulators (SERMs): Secondary | ICD-10-CM | POA: Insufficient documentation

## 2022-05-17 LAB — IRON AND TIBC
Iron: 47 ug/dL (ref 28–170)
Saturation Ratios: 9 % — ABNORMAL LOW (ref 10.4–31.8)
TIBC: 514 ug/dL — ABNORMAL HIGH (ref 250–450)
UIBC: 467 ug/dL

## 2022-05-17 LAB — CBC WITH DIFFERENTIAL/PLATELET
Abs Immature Granulocytes: 0.02 10*3/uL (ref 0.00–0.07)
Basophils Absolute: 0.1 10*3/uL (ref 0.0–0.1)
Basophils Relative: 1 %
Eosinophils Absolute: 0.4 10*3/uL (ref 0.0–0.5)
Eosinophils Relative: 6 %
HCT: 35 % — ABNORMAL LOW (ref 36.0–46.0)
Hemoglobin: 11 g/dL — ABNORMAL LOW (ref 12.0–15.0)
Immature Granulocytes: 0 %
Lymphocytes Relative: 36 %
Lymphs Abs: 2.4 10*3/uL (ref 0.7–4.0)
MCH: 25.1 pg — ABNORMAL LOW (ref 26.0–34.0)
MCHC: 31.4 g/dL (ref 30.0–36.0)
MCV: 79.7 fL — ABNORMAL LOW (ref 80.0–100.0)
Monocytes Absolute: 0.6 10*3/uL (ref 0.1–1.0)
Monocytes Relative: 9 %
Neutro Abs: 3.2 10*3/uL (ref 1.7–7.7)
Neutrophils Relative %: 48 %
Platelets: 236 10*3/uL (ref 150–400)
RBC: 4.39 MIL/uL (ref 3.87–5.11)
RDW: 16.6 % — ABNORMAL HIGH (ref 11.5–15.5)
WBC: 6.6 10*3/uL (ref 4.0–10.5)
nRBC: 0 % (ref 0.0–0.2)

## 2022-05-17 LAB — COMPREHENSIVE METABOLIC PANEL
ALT: 39 U/L (ref 0–44)
AST: 36 U/L (ref 15–41)
Albumin: 3.8 g/dL (ref 3.5–5.0)
Alkaline Phosphatase: 63 U/L (ref 38–126)
Anion gap: 5 (ref 5–15)
BUN: 12 mg/dL (ref 6–20)
CO2: 23 mmol/L (ref 22–32)
Calcium: 8.1 mg/dL — ABNORMAL LOW (ref 8.9–10.3)
Chloride: 110 mmol/L (ref 98–111)
Creatinine, Ser: 0.61 mg/dL (ref 0.44–1.00)
GFR, Estimated: >60 mL/min (ref >60)
Glucose, Bld: 106 mg/dL — ABNORMAL HIGH (ref 70–99)
Potassium: 3.5 mmol/L (ref 3.5–5.1)
Sodium: 138 mmol/L (ref 135–145)
Total Bilirubin: 0.4 mg/dL (ref 0.3–1.2)
Total Protein: 7.1 g/dL (ref 6.5–8.1)

## 2022-05-17 LAB — FERRITIN: Ferritin: 10 ng/mL — ABNORMAL LOW (ref 11–307)

## 2022-05-17 LAB — VITAMIN D 25 HYDROXY (VIT D DEFICIENCY, FRACTURES): Vit D, 25-Hydroxy: 39.26 ng/mL (ref 30–100)

## 2022-05-17 MED ORDER — IRON-VITAMIN C 65-125 MG PO TABS: 1.0000 | ORAL_TABLET | Freq: Every day | ORAL | 3 refills | Status: AC

## 2022-05-17 NOTE — Assessment & Plan Note (Addendum)
Continue calcium 1200mg  and vitamin D  Calcium 8.1, check PTH, 25 hydroxyVit D IV Calcium gluconate 2g x 1

## 2022-05-17 NOTE — Addendum Note (Signed)
Addended by: Earlie Server on: 05/17/2022 08:15 PM   Modules accepted: Orders

## 2022-05-17 NOTE — Assessment & Plan Note (Signed)
Iron panel is consistent with IDA Recommend patient to start oral Vitron C 1 tab daily.  She has upcoming colonoscopy

## 2022-05-17 NOTE — Assessment & Plan Note (Addendum)
Right breast Stage 1A mucinous carcinoma. Case was discussed on tumor board on 08/01/2020, per Dr. Reuel Derby, this is a few mucinous carcinoma, favorable histology.Status post adjuvant radiation Labs reviewed and discussed with Continue adjuvant tamoxifen 20 mg daily. Chest [facility], Estradiol annual pelvic examination by gynecologist -   If she decides to get Rifampin for treatment of TB,i will switch to ovarian suppression + AI. Patient does not want to take TB treatment currently and prefers to stay on Tamoxifen

## 2022-05-17 NOTE — Progress Notes (Signed)
Pt here for follow up. No new breast concerns.  ?

## 2022-05-17 NOTE — Progress Notes (Signed)
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 Case was discussed on tumor board on 08/01/2020, per Dr. Reuel Derby, this is a few mucinous carcinoma, favorable histology.Status post adjuvant radiation Labs reviewed and discussed with Continue adjuvant tamoxifen 20 mg daily. Chest [facility], Estradiol annual pelvic examination by gynecologist -   If she decides to get Rifampin for treatment of TB,i will switch to ovarian suppression + AI. Patient does not want to take TB treatment currently and prefers to stay on Tamoxifen  Hypocalcemia Continue calcium 1200mg  and vitamin D  Calcium 8.1, check PTH, 25 hydroxyVit D IV Calcium gluconate 2g x 1  IDA (iron deficiency anemia) Iron panel is consistent with IDA Recommend patient to start oral Vitron C 1 tab daily.  She has upcoming colonoscopy  Orders Placed This Encounter  Procedures   Parathyroid hormone,  intact and calcium    Standing Status:   Future    Number of Occurrences:   1    Standing Expiration Date:   05/17/2023   VITAMIN D 25 Hydroxy (Vit-D Deficiency, Fractures)    Standing Status:   Future    Number of Occurrences:   1    Standing Expiration Date:   05/17/2023   Iron and TIBC    Standing Status:   Future    Number of Occurrences:   1    Standing Expiration Date:   05/17/2023   Ferritin    Standing Status:   Future    Number of Occurrences:   1    Standing Expiration Date:   05/17/2023   Estradiol    Standing Status:   Future    Number of Occurrences:   1    Standing Expiration  Date:   0000000   Follicle stimulating hormone    Standing Status:   Future    Number of Occurrences:   1    Standing Expiration Date:   05/17/2023   CBC with Differential (Cancer Center Only)    Standing Status:   Future    Standing Expiration Date:   05/17/2023   CMP ([place] only)    Standing Status:   Future    Standing Expiration Date:   05/17/2023   Follow up in 6 months All questions were answered. The patient knows to call the clinic with any problems, questions or concerns.  Earlie Server, MD, PhD [facility] Hematology Oncology 05/17/2022     HISTORY OF PRESENTING ILLNESS:   Annette Le is a  52 y.o.  female with PMH listed below was seen in consultation at the request of  Jearld Fenton, NP  for evaluation of breast cancer.   She felt a painful right breast nodule for about 6 weeks. Went to see primary care physician and mammogram was obtained.  06/17/2020 bilateral diagnostic mammogram showed 14 x 15 x 12 mm right upper outer breast mass, 11 o'clock 2cm from nipple. close to the site of palpable/painful concern. And 2 other probably benign mass at 12 o'clock.   06/22/2020 Right upper outer breast mass biopsy is positive for  invasive mammary carcinoma with mucinous features.  Grade 2, DCIS not identified.  LVI not identified.  Patient is scheduled to have additional ultrasound-guided biopsy of the 2 masses at 12 o'clock position of the right breast. Patient presents to establish care discussed.  She has appointment scheduled to see surgery with Dr.Rodenberg.  She was accompanied by her husband.  Family history of breast cancer: Maternal aunt was diagnosed with breast cancer at the age of 73 Family history of other cancers: Maternal aunt stomach cancer, maternal grandfather pancreatic cancer, maternal aunt lung cancer-also a smoker.  Menarche: 52 years of age  premenopausal, LMP 06/22/2020 Number of pregnancies :  Age at first live childbirth: Used OCP: Remote  use for a few months. Used estrogen and progesterone therapy: Denies History of Radiation to the chest: Denies Previous of breast biopsy: Denies  06/29/2020, right breast 12:00 retroareolar ultrasound-guided biopsy showed negative for cancer 07/08/2020, MRI breast bilateral with and without contrast showed known biopsy-proven malignancy over the upper outer quadrant of the right breast.  No additional suspicious masses or abdominal enhancement within either breast. 07/25/2020 underwent right breast lumpectomy and sentinel lymph node biopsy Pathology showed mucinous carcinoma, 1.5 cm, grade 2, 4 sentinel lymph nodes were excised and 0 was involved with malignancy.  Margins were all negative.  pT1c pN0, Per previous biopsy, ER 90%, PR 90%, HER2 negative by immunohistochemistry Case was discussed on tumor board on 08/01/20 Consensus was reached to proceed with adjuvant radiation followed by antiestrogen treatments.  Family history of cancer, genetic testing is negative.   VUS in POLE and TERT  08/24/2020-09/14/2020 adjuvant radiation 10/05/2020 started on Tamoxifen. Overall she tolerates well.   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  Per Dr.Nunez, she reports possibly being treated for latent TB in [place], and she took medication for 6 months. She was not able to remember the name of the medication. Dr.Nunez feels that she does not need to be treated for latent TB for twice, and treatment is optional.   INTERVAL HISTORY Annette Le is a 52 y.o. female who has above history reviewed by me today presents for follow up visit for management of right breast cancer She report feeling well,  + muscle cramps. She takes calcium and Vitamin D supplementations. No new breast concerns.     Review of Systems  Constitutional:  Negative for appetite change, chills, fatigue and fever.  HENT:   Negative for hearing loss and voice change.   Eyes:  Negative for eye  problems.  Respiratory:  Negative for chest tightness and cough.   Cardiovascular:  Negative for chest pain.  Gastrointestinal:  Negative for abdominal distention, abdominal pain and blood in stool.  Endocrine: Positive for hot flashes.  Genitourinary:  Negative for difficulty urinating and frequency.   Musculoskeletal:  Negative for arthralgias and back pain.  Skin:  Negative for itching and rash.  Neurological:  Negative for extremity weakness.  Hematological:  Negative for adenopathy.  Psychiatric/Behavioral:  Negative for confusion.     MEDICAL HISTORY:  Past Medical History:  Diagnosis Date   Allergy    Asthma    Carpal tunnel syndrome    Family history of breast cancer    Family history of colon cancer    Family history of pancreatic cancer    Family history of stomach cancer    Knee injury    Personal history of radiation therapy 08/03/2020   Breast    SURGICAL HISTORY:  Past Surgical History:  Procedure Laterality Date   BREAST BIOPSY Right 06/22/2020   u/s bx @ 11:00-"vision" clip-INVASIVE MAMMARY CARCINOMA WITH MUCINOUS FEATURES.   BREAST BIOPSY Right 06/29/2020   [place] bx at 12:00, x marker, benign   BREAST LUMPECTOMY Right 07/15/2020   Mucinous carcinoma   NEG margins   BREAST LUMPECTOMY,RADIO FREQ LOCALIZER,AXILLARY SENTINEL LYMPH NODE BIOPSY Right 07/25/2020   Procedure: BREAST LUMPECTOMY,RADIO FREQ LOCALIZER,AXILLARY SENTINEL LYMPH NODE BIOPSY;  Surgeon: Ronny Bacon, MD;  Location: [facility] ORS;  Service: General;  Laterality: Right;    SOCIAL HISTORY: Social History   Socioeconomic History   Marital status: Married    Spouse name: Not on file   Number of children: Not on file   Years of education: Not on file   Highest education level: Not on file  Occupational History   Not on file  Tobacco Use   Smoking status: Former    Types: Cigarettes    Quit date: 10/11/2003    Years since quitting: 18.6   Smokeless tobacco: Never  Vaping Use   Vaping Use:  Never used  Substance and Sexual Activity   Alcohol use: Yes    Comment: occ   Drug use: Never   Sexual activity: Not on file  Other Topics Concern   Not on file  Social History Narrative   Not on file   Social Determinants of Health   Financial Resource Strain: Low Risk  (07/27/2021)   Overall Financial Resource Strain (CARDIA)    Difficulty of Paying Living Expenses: Not very hard  Food Insecurity: No Food Insecurity (07/27/2021)   Hunger Vital Sign    Worried About Running Out of Food in the Last Year: Never true    Ran Out of Food in the Last Year: Never true  Transportation Needs: No Transportation Needs (07/27/2021)   PRAPARE - Hydrologist (Medical): No    Lack of Transportation (Non-Medical): No  Physical Activity: Not on file  Stress: Stress Concern Present (07/27/2021)   [place]    Feeling of Stress : To some extent  Social Connections: Not on file  Intimate Partner Violence: Not on file    FAMILY HISTORY: Family History  Problem Relation Age of Onset   Colon cancer Maternal Aunt    Lung cancer Maternal Aunt    Stomach cancer Maternal Aunt    Cancer Father 37       intestinal vs. stomach   Pancreatic cancer Maternal Grandfather        d. 66s   Breast cancer Cousin     ALLERGIES:  is allergic to shrimp extract, ibuprofen, shrimp [shellfish allergy], tylenol [acetaminophen], and other.  MEDICATIONS:  Current Outpatient Medications  Medication Sig Dispense Refill   Calcium Carb-Cholecalciferol (CALTRATE 600+D3) 600-800 MG-UNIT TABS Take by mouth.     tamoxifen (NOLVADEX) 20 MG tablet TAKE 1 TABLET DAILY 90 tablet 3   VITAMIN E PO Take by mouth.     No current facility-administered medications for this visit.     PHYSICAL EXAMINATION: ECOG PERFORMANCE STATUS: 0 - Asymptomatic Vitals:   05/17/22 1447  BP: 117/86  Pulse: 73  Resp: 18  Temp: 97.7 F (36.5 C)    Filed Weights   05/17/22 1447  Weight: 153 lb 8 oz (69.6 kg)    Physical Exam Constitutional:      General: She is not in acute distress. HENT:     Head: Normocephalic  and atraumatic.  Eyes:     General: No scleral icterus. Cardiovascular:     Rate and Rhythm: Normal rate and regular rhythm.     Heart sounds: Normal heart sounds.  Pulmonary:     Effort: Pulmonary effort is normal. No respiratory distress.     Breath sounds: No wheezing.  Abdominal:     General: Bowel sounds are normal. There is no distension.     Palpations: Abdomen is soft.  Musculoskeletal:        General: No deformity. Normal range of motion.     Cervical back: Normal range of motion and neck supple.  Skin:    General: Skin is warm and dry.     Findings: No erythema or rash.  Neurological:     Mental Status: She is alert and oriented to person, place, and time. Mental status is at baseline.     Cranial Nerves: No cranial nerve deficit.     Coordination: Coordination normal.  Psychiatric:        Mood and Affect: Mood normal.     LABORATORY DATA:  I have reviewed the data as listed    Latest Ref Rng & Units 05/17/2022    2:28 PM 11/16/2021    1:26 PM 05/16/2021   12:57 PM  CBC  WBC 4.0 - 10.5 K/uL 6.6  5.1  5.5   Hemoglobin 12.0 - 15.0 g/dL 11.0  13.1  12.5   Hematocrit 36.0 - 46.0 % 35.0  39.6  39.3   Platelets 150 - 400 K/uL 236  236  228       Latest Ref Rng & Units 05/17/2022    2:28 PM 11/16/2021    1:26 PM 05/16/2021   12:57 PM  CMP  Glucose 70 - 99 mg/dL 106  226  102   BUN 6 - 20 mg/dL 12  12  9    Creatinine 0.44 - 1.00 mg/dL 0.61  0.79  0.67   Sodium 135 - 145 mmol/L 138  136  139   Potassium 3.5 - 5.1 mmol/L 3.5  3.9  4.3   Chloride 98 - 111 mmol/L 110  108  108   CO2 22 - 32 mmol/L 23  22  24    Calcium 8.9 - 10.3 mg/dL 8.1  8.4  8.6   Total Protein 6.5 - 8.1 g/dL 7.1  7.1  7.1   Total Bilirubin 0.3 - 1.2 mg/dL 0.4  0.4  0.4   Alkaline Phos 38 - 126 U/L 63  60  58   AST 15 -  41 U/L 36  41  36   ALT 0 - 44 U/L 39  37  49      RADIOGRAPHIC STUDIES: I have personally reviewed the radiological images as listed and agreed with the findings in the report. No results found.

## 2022-05-18 ENCOUNTER — Inpatient Hospital Stay: Payer: BC Managed Care – PPO

## 2022-05-18 ENCOUNTER — Ambulatory Visit: Payer: BC Managed Care – PPO

## 2022-05-18 DIAGNOSIS — Z170 Estrogen receptor positive status [ER+]: Secondary | ICD-10-CM | POA: Diagnosis not present

## 2022-05-18 DIAGNOSIS — C50411 Malignant neoplasm of upper-outer quadrant of right female breast: Secondary | ICD-10-CM | POA: Diagnosis not present

## 2022-05-18 DIAGNOSIS — Z79810 Long term (current) use of selective estrogen receptor modulators (SERMs): Secondary | ICD-10-CM | POA: Diagnosis not present

## 2022-05-18 DIAGNOSIS — D509 Iron deficiency anemia, unspecified: Secondary | ICD-10-CM | POA: Diagnosis not present

## 2022-05-18 LAB — PTH, INTACT AND CALCIUM
Calcium, Total (PTH): 9.1 mg/dL (ref 8.7–10.2)
PTH: 20 pg/mL (ref 15–65)

## 2022-05-18 LAB — FOLLICLE STIMULATING HORMONE: FSH: 7.4 m[IU]/mL

## 2022-05-18 LAB — ESTRADIOL: Estradiol: 728 pg/mL

## 2022-05-18 MED ORDER — SODIUM CHLORIDE 0.9 % IV SOLN
INTRAVENOUS | Status: DC
  Filled 2022-05-18: qty 250

## 2022-05-18 MED ORDER — SODIUM CHLORIDE 0.9 % IV SOLN
2.0000 g | Freq: Once | INTRAVENOUS | Status: AC
  Administered 2022-05-18: 2 g via INTRAVENOUS
  Filled 2022-05-18: qty 20

## 2022-05-28 DIAGNOSIS — Z1211 Encounter for screening for malignant neoplasm of colon: Secondary | ICD-10-CM | POA: Diagnosis not present

## 2022-05-30 IMAGING — MG DIGITAL DIAGNOSTIC BILAT W/ TOMO W/ CAD
6 of 9 series · 6 of 25 positions shown · non-contrast
Comparison: Previous exam(s).

CLINICAL DATA: Status post RIGHT lumpectomy in July 2020 for
invasive mammary carcinoma with mucinous features.

EXAM:
DIGITAL DIAGNOSTIC BILATERAL MAMMOGRAM WITH TOMOSYNTHESIS AND CAD
TECHNIQUE: Bilateral digital diagnostic mammography and breast tomosynthesis
was performed. The images were evaluated with computer-aided
detection.

[R MLO]
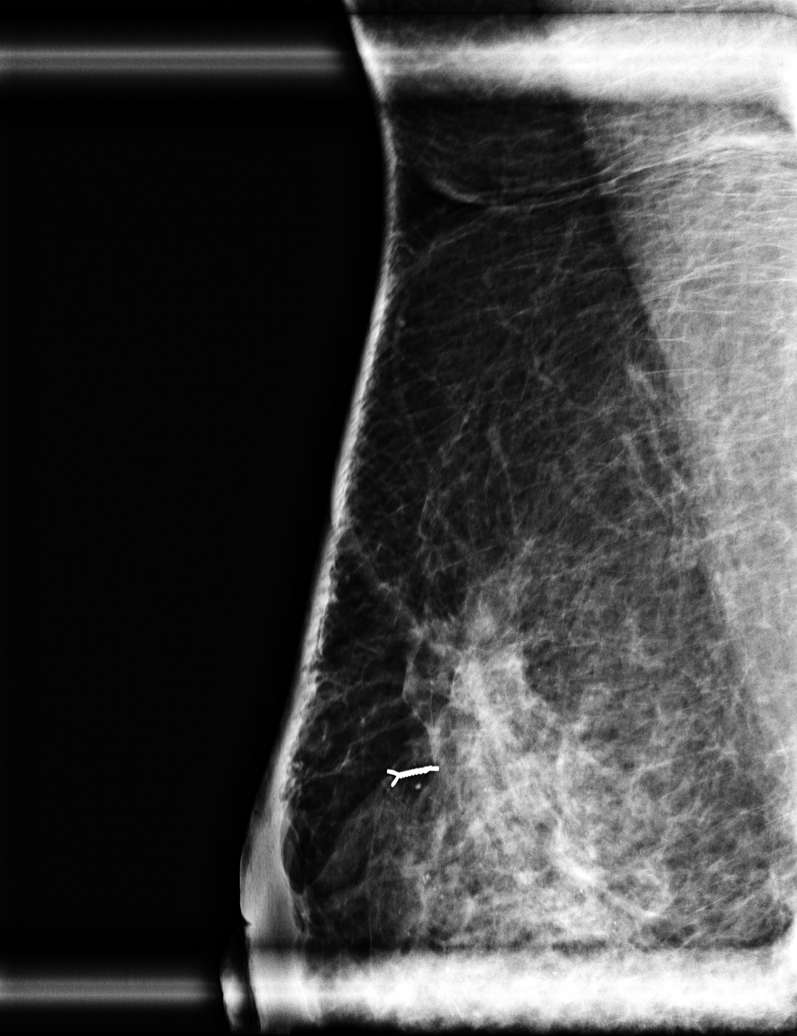

[R CC synth-2D]
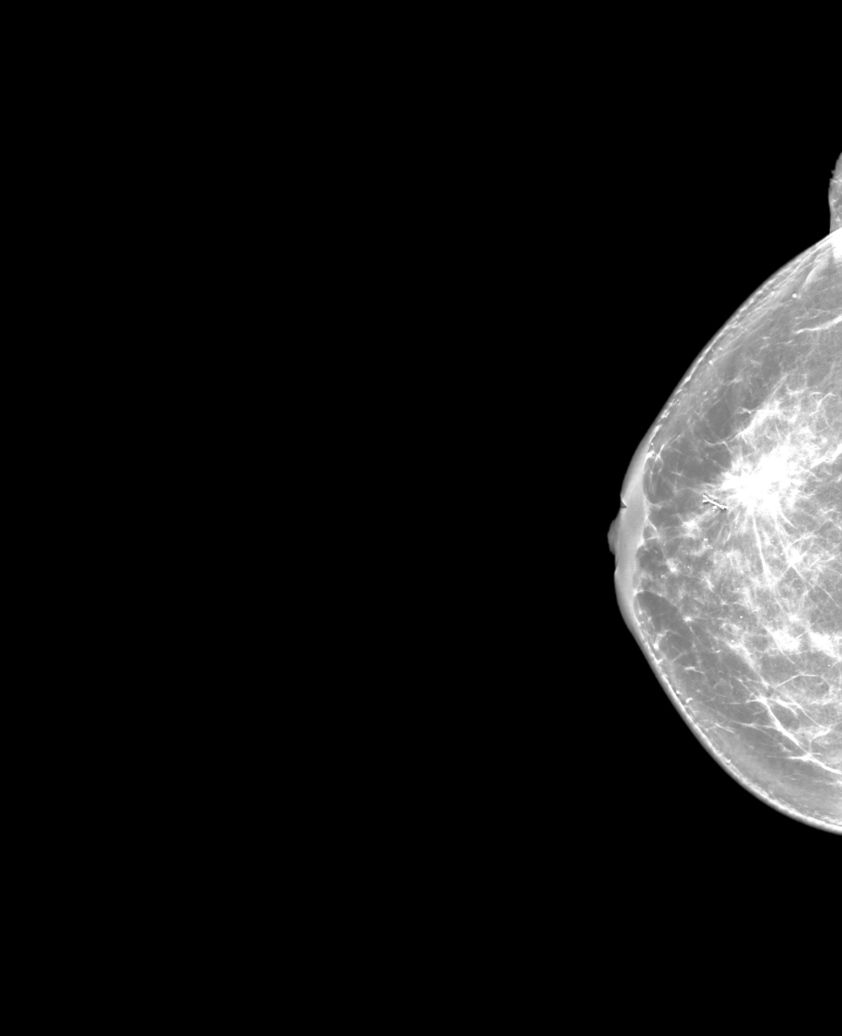

[L CC synth-2D]
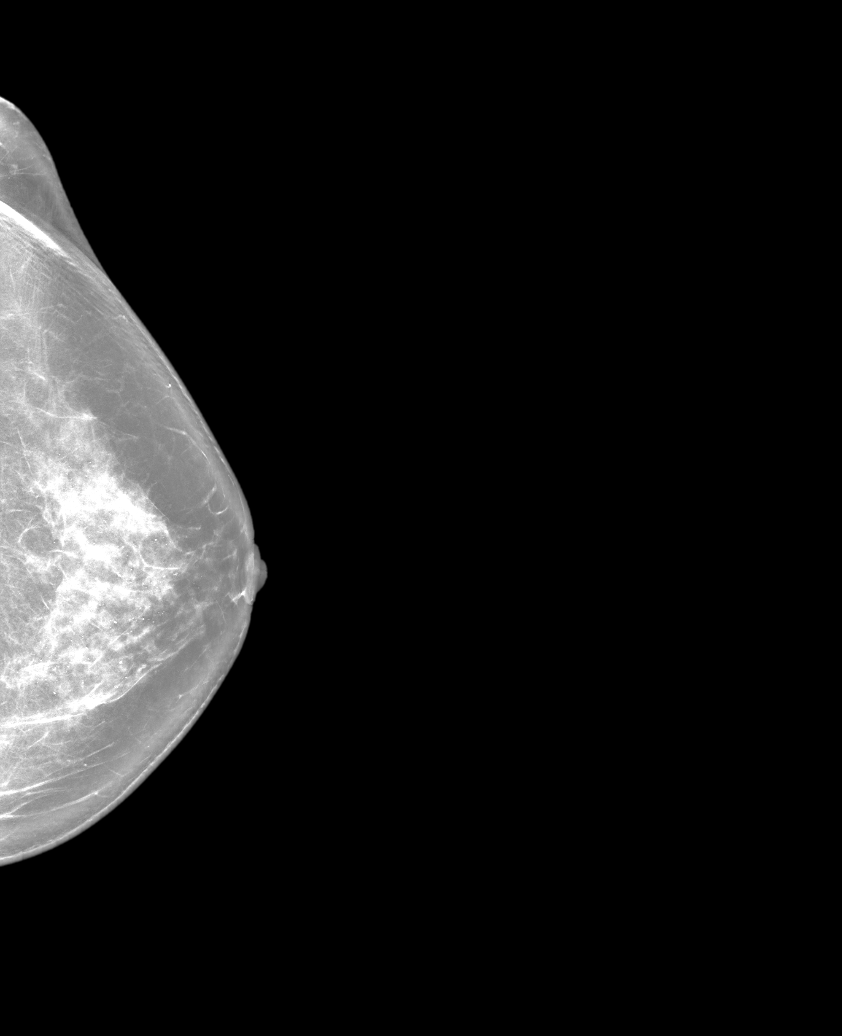

[R MLO synth-2D]
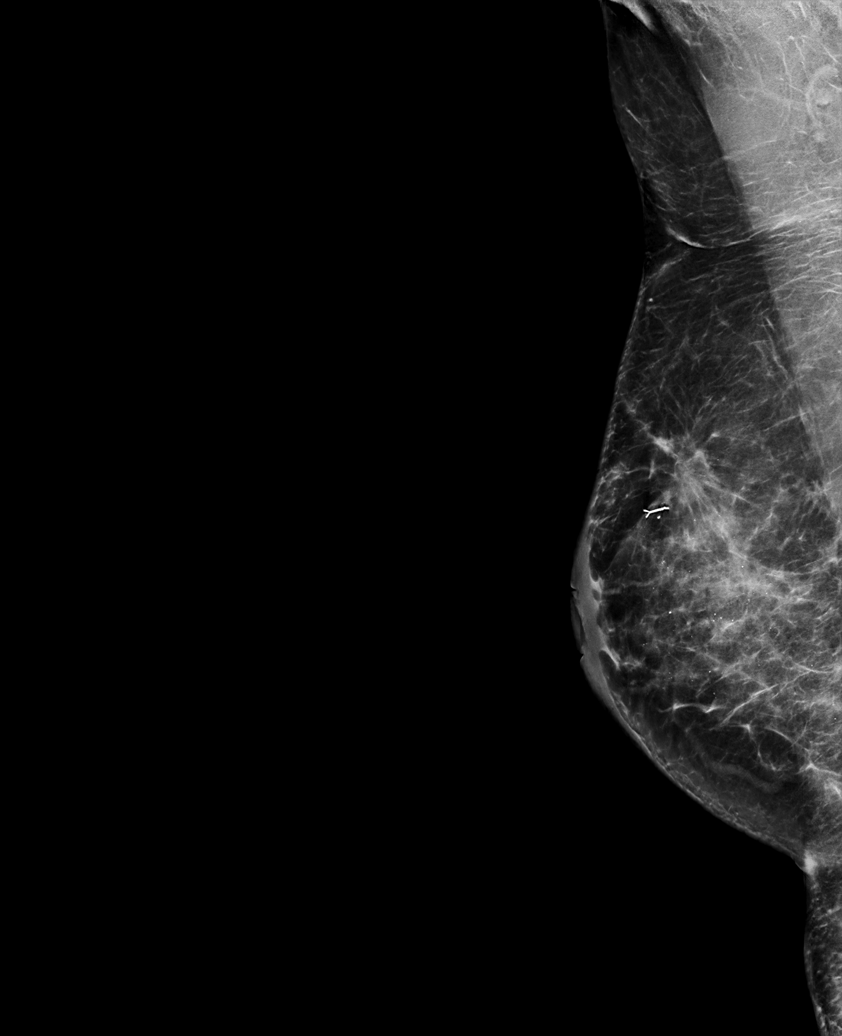

[L MLO synth-2D]
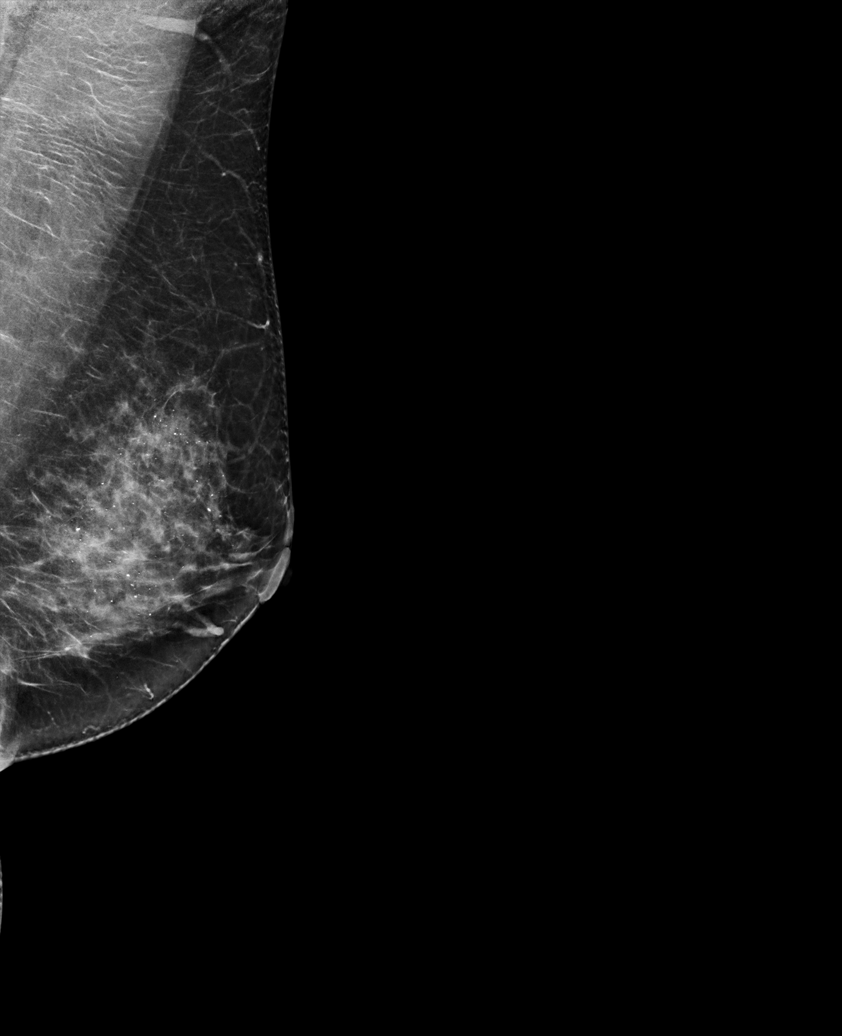

[L CC tomo · tomo slice 41/81.0]
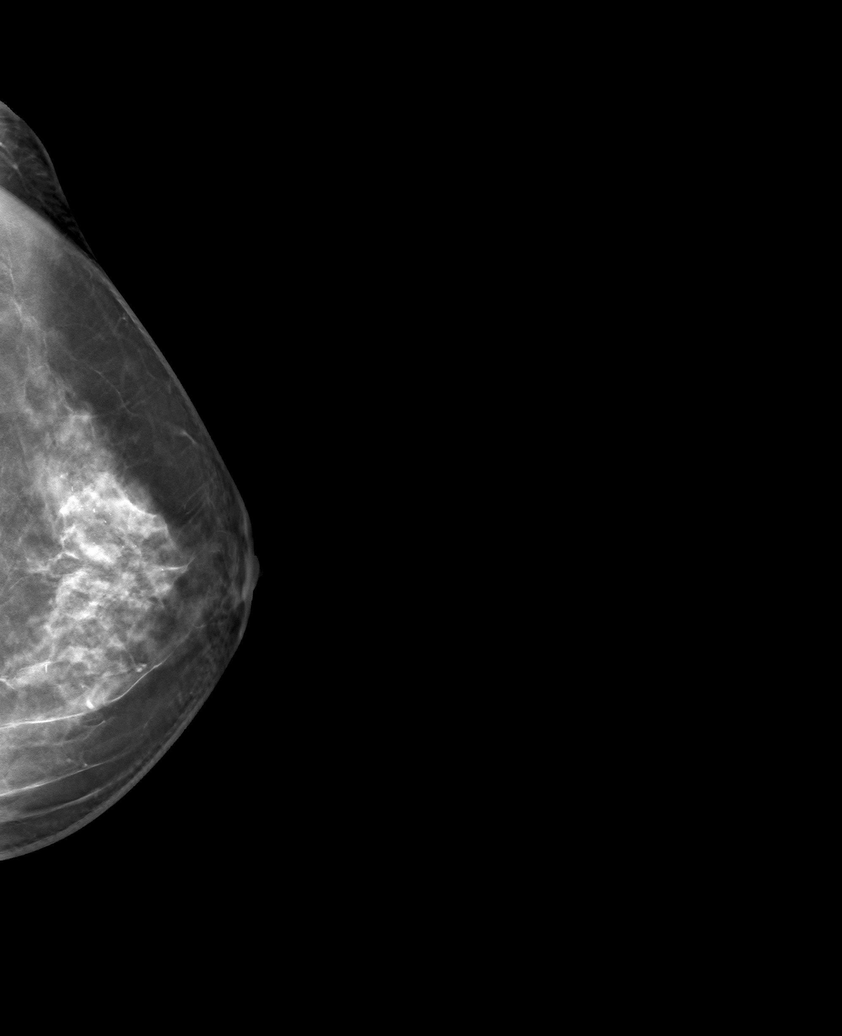

[6 of 25 positions shown; findings below may reference images not displayed]

ACR Breast Density Category c: The breast tissue is heterogeneously
dense, which may obscure small masses.
FINDINGS: There is density and architectural distortion within the RIGHT
breast, consistent with postsurgical changes. These are stable in
comparison to prior. No suspicious mass, distortion, or
microcalcifications are identified to suggest presence of
malignancy.
IMPRESSION: No mammographic evidence of malignancy bilaterally.

RECOMMENDATION:
1. Recommend bilateral diagnostic mammogram in 1 year.
2. Given diagnosis of breast cancer at a young age and
heterogeneously dense breast tissue, recommend supplemental
screening with annual breast MRI. The American Cancer Society
recommends annual MRI and mammography in patients with an estimated
lifetime risk of developing breast cancer greater than 20 - 25%, or
who are known or suspected to be positive for the breast cancer
gene.

I have discussed the findings and recommendations with the patient.
If applicable, a reminder letter will be sent to the patient
regarding the next appointment.

BI-RADS CATEGORY  2: Benign.

## 2022-06-01 ENCOUNTER — Ambulatory Visit
Admission: EM | Admit: 2022-06-01 | Discharge: 2022-06-01 | Disposition: A | Discharge status: Home / Self Care | Payer: BC Managed Care – PPO

## 2022-06-01 DIAGNOSIS — M25461 Effusion, right knee: Secondary | ICD-10-CM

## 2022-06-01 DIAGNOSIS — M25462 Effusion, left knee: Secondary | ICD-10-CM | POA: Diagnosis not present

## 2022-06-01 NOTE — ED Triage Notes (Signed)
Patient presents to [facility] for bilateral knee pain since last Saturday. States she attended a wedding and after noticed the swelling. Not treating swelling.

## 2022-06-01 NOTE — Discharge Instructions (Addendum)
Mended evaluation at an orthopedic provider who can perform ultrasound and determine if there is fluid in your joint space.

## 2022-06-01 NOTE — ED Provider Notes (Signed)
Annette Le    CSN: [id number] Arrival date & time: 06/01/22  1417      History   Chief Complaint Chief Complaint  Patient presents with   Joint Swelling    HPI Annette Le is a 52 y.o. female.   HPI  Presents to urgent care with complaint of bilateral knee pain since 1 week.  Patient states she attended a wedding and afterward, noticed the swelling.  PMH includes latent TB, invasive carcinoma of breast and long-term treatment with tamoxifen.  Under care of of oncology.  Patient states she is very careful about taking medication.  Past Medical History:  Diagnosis Date   Allergy    Asthma    Carpal tunnel syndrome    Family history of breast cancer    Family history of colon cancer    Family history of pancreatic cancer    Family history of stomach cancer    Knee injury    Personal history of radiation therapy 08/03/2020   Breast    Patient Active Problem List   Diagnosis Date Noted   IDA (iron deficiency anemia) 05/17/2022   TB lung, latent 11/16/2021   Long-term current use of tamoxifen 11/16/2021   Hypocalcemia 11/16/2021   Overweight (BMI 25.0-29.9) 08/10/2020   Invasive carcinoma of breast 06/28/2020   Anemia 06/28/2020    Past Surgical History:  Procedure Laterality Date   BREAST BIOPSY Right 06/22/2020   u/s bx @ 11:00-"vision" clip-INVASIVE MAMMARY CARCINOMA WITH MUCINOUS FEATURES.   BREAST BIOPSY Right 06/29/2020   [place] bx at 12:00, x marker, benign   BREAST LUMPECTOMY Right 07/15/2020   Mucinous carcinoma   NEG margins   BREAST LUMPECTOMY,RADIO FREQ LOCALIZER,AXILLARY SENTINEL LYMPH NODE BIOPSY Right 07/25/2020   Procedure: BREAST LUMPECTOMY,RADIO FREQ LOCALIZER,AXILLARY SENTINEL LYMPH NODE BIOPSY;  Surgeon: Campbell Lerner, MD;  Location: [facility] ORS;  Service: General;  Laterality: Right;    OB History   No obstetric history on file.      Home Medications    Prior to Admission medications   Medication Sig Start Date End  Date Taking? Authorizing Provider  Calcium Carb-Cholecalciferol (CALTRATE 600+D3) 600-800 MG-UNIT TABS Take by mouth.    [provider]  Iron-Vitamin C 65-125 MG TABS Take 1 tablet by mouth daily at 2 PM. 05/17/22   Rickard Patience, MD  tamoxifen (NOLVADEX) 20 MG tablet TAKE 1 TABLET DAILY 10/27/21   Rickard Patience, MD  VITAMIN E PO Take by mouth.    [provider]    Family History Family History  Problem Relation Age of Onset   Colon cancer Maternal Aunt    Lung cancer Maternal Aunt    Stomach cancer Maternal Aunt    Cancer Father 77       intestinal vs. stomach   Pancreatic cancer Maternal Grandfather        d. 47s   Breast cancer Cousin     Social History Social History   Tobacco Use   Smoking status: Former    Types: Cigarettes    Quit date: 10/11/2003    Years since quitting: 18.6   Smokeless tobacco: Never  Vaping Use   Vaping Use: Never used  Substance Use Topics   Alcohol use: Yes    Comment: occ   Drug use: Never     Allergies   Shrimp extract, Ibuprofen, Shrimp [shellfish allergy], Tylenol [acetaminophen], and Other   Review of Systems Review of Systems   Physical Exam Triage Vital Signs ED Triage  Vitals [06/01/22 1427]  Enc Vitals Group     BP      Pulse      Resp      Temp      Temp src      SpO2      Weight      Height      Head Circumference      Peak Flow      Pain Score 8     Pain Loc      Pain Edu?      Excl. in GC?    No data found.  Updated Vital Signs There were no vitals taken for this visit.  Visual Acuity Right Eye Distance:   Left Eye Distance:   Bilateral Distance:    Right Eye Near:   Left Eye Near:    Bilateral Near:     Physical Exam Vitals reviewed.  Constitutional:      Appearance: Normal appearance.  Musculoskeletal:        General: Normal range of motion.     Right knee: No swelling.     Left knee: No swelling.     Comments: Obvious swelling or effusions in her knees bilaterally.  Skin:     General: Skin is warm and dry.  Neurological:     General: No focal deficit present.     Mental Status: She is alert and oriented to person, place, and time.  Psychiatric:        Mood and Affect: Mood normal.        Behavior: Behavior normal.      UC Treatments / Results  Labs (all labs ordered are listed, but only abnormal results are displayed) Labs Reviewed - No data to display  EKG   Radiology No results found.  Procedures Procedures (including critical care time)  Medications Ordered in UC Medications - No data to display  Initial Impression / Assessment and Plan / UC Course  I have reviewed the triage vital signs and the nursing notes.  Pertinent labs & imaging results that were available during my care of the patient were reviewed by me and considered in my medical decision making (see chart for details).   Annette Le is a 52 y.o. female presenting with bilateral knee pain x 1 week. Patient is afebrile without recent antipyretics, satting well on room air. Overall is well appearing though non-toxic, well hydrated, without respiratory distress.  No obvious swelling or effusions are present in her knees bilaterally.  Informed patient that, especially given her other medical problems, I would not perform a joint aspiration of her knees because I could not definitely identify an effusion and would not want to take the risk of introducing infection into her joint space.  Recommended she seek evaluation at an orthopedic urgent care with ultrasound capability.  Patient verbalized understanding and agreement with care plan.   Final Clinical Impressions(s) / UC Diagnoses   Final diagnoses:  None   Discharge Instructions   None    ED Prescriptions   None    PDMP not reviewed this encounter.   Charma Igo, [place] 06/01/22 1451

## 2022-07-03 ENCOUNTER — Ambulatory Visit: Payer: BC Managed Care – PPO

## 2022-07-03 ENCOUNTER — Encounter: Payer: Self-pay | Admitting: Oncology

## 2022-07-03 DIAGNOSIS — Z1211 Encounter for screening for malignant neoplasm of colon: Secondary | ICD-10-CM | POA: Diagnosis not present

## 2022-07-03 DIAGNOSIS — K5730 Diverticulosis of large intestine without perforation or abscess without bleeding: Secondary | ICD-10-CM | POA: Diagnosis not present

## 2022-07-03 DIAGNOSIS — K640 First degree hemorrhoids: Secondary | ICD-10-CM | POA: Diagnosis not present

## 2022-07-06 ENCOUNTER — Other Ambulatory Visit: Payer: Self-pay | Admitting: Surgery

## 2022-07-06 DIAGNOSIS — Z853 Personal history of malignant neoplasm of breast: Secondary | ICD-10-CM

## 2022-07-19 ENCOUNTER — Ambulatory Visit
Admission: RE | Admit: 2022-07-19 | Discharge: 2022-07-19 | Disposition: A | Discharge status: Home / Self Care | Payer: BC Managed Care – PPO | Source: Ambulatory Visit | Attending: Surgery | Admitting: Surgery

## 2022-07-19 DIAGNOSIS — Z853 Personal history of malignant neoplasm of breast: Secondary | ICD-10-CM | POA: Insufficient documentation

## 2022-07-19 DIAGNOSIS — R921 Mammographic calcification found on diagnostic imaging of breast: Secondary | ICD-10-CM | POA: Diagnosis not present

## 2022-07-20 ENCOUNTER — Other Ambulatory Visit: Payer: Self-pay | Admitting: Surgery

## 2022-07-20 DIAGNOSIS — R928 Other abnormal and inconclusive findings on diagnostic imaging of breast: Secondary | ICD-10-CM

## 2022-07-20 DIAGNOSIS — R921 Mammographic calcification found on diagnostic imaging of breast: Secondary | ICD-10-CM

## 2022-07-24 ENCOUNTER — Ambulatory Visit
Admission: RE | Admit: 2022-07-24 | Discharge: 2022-07-24 | Disposition: A | Discharge status: Home / Self Care | Payer: BC Managed Care – PPO | Source: Ambulatory Visit | Attending: Surgery | Admitting: Surgery

## 2022-07-24 DIAGNOSIS — R928 Other abnormal and inconclusive findings on diagnostic imaging of breast: Secondary | ICD-10-CM | POA: Insufficient documentation

## 2022-07-24 DIAGNOSIS — R921 Mammographic calcification found on diagnostic imaging of breast: Secondary | ICD-10-CM

## 2022-07-24 HISTORY — PX: BREAST BIOPSY: SHX20

## 2022-07-24 MED ORDER — LIDOCAINE HCL 1 % IJ SOLN
5.0000 mL | Freq: Once | INTRAMUSCULAR | Status: AC
  Administered 2022-07-24: 5 mL
  Filled 2022-07-24: qty 5

## 2022-07-24 MED ORDER — LIDOCAINE-EPINEPHRINE 1 %-1:100000 IJ SOLN
20.0000 mL | Freq: Once | INTRAMUSCULAR | Status: AC
  Administered 2022-07-24: 20 mL
  Filled 2022-07-24: qty 20

## 2022-07-25 LAB — SURGICAL PATHOLOGY

## 2022-08-09 ENCOUNTER — Ambulatory Visit (INDEPENDENT_AMBULATORY_CARE_PROVIDER_SITE_OTHER): Payer: BC Managed Care – PPO | Admitting: Physician Assistant

## 2022-08-09 ENCOUNTER — Encounter: Payer: Self-pay | Admitting: Oncology

## 2022-08-09 VITALS — BP 104/70 | HR 89 | Temp 98.1°F | Wt 151.8 lb

## 2022-08-09 DIAGNOSIS — J069 Acute upper respiratory infection, unspecified: Secondary | ICD-10-CM

## 2022-08-09 DIAGNOSIS — R051 Acute cough: Secondary | ICD-10-CM

## 2022-08-09 MED ORDER — BENZONATATE 100 MG PO CAPS
100.0000 mg | ORAL_CAPSULE | Freq: Two times a day (BID) | ORAL | 0 refills | Status: DC | PRN
Start: 2022-08-09 — End: 2023-05-20

## 2022-08-09 NOTE — Patient Instructions (Addendum)
Based on your described symptoms and the duration of symptoms it is likely that you have a viral upper respiratory infection (often called a "cold")   Symptoms can last for 3-10 days with lingering cough and intermittent symptoms lasting weeks after that.  The goal of treatment at this time is to reduce your symptoms and discomfort   I have sent in Tessalon pearls for you to take twice per day to help with your cough  You can use over the counter medications such as Dayquil/Nyquil, AlkaSeltzer formulations, etc to provide further relief of symptoms according to the manufacturer's instructions     We will keep you updated on the results of your COVID testing and strep culture once it is available.   Your Flu testing and rapid strep were both negative    If your symptoms do not improve or become worse in the next 5-7 days please make an apt at the office so we can see you  Go to the ER if you begin to have more serious symptoms such as shortness of breath, trouble breathing, loss of consciousness, swelling around the eyes, high fever, severe lasting headaches, vision changes or neck pain/stiffness.

## 2022-08-09 NOTE — Progress Notes (Signed)
Acute Office Visit   Patient: Annette Le   DOB: October 22, 1970   52 y.o. Female  MRN: [id number] Visit Date: 08/09/2022  Today's healthcare provider: Oswaldo Conroy Jarren Para, PA-C  Introduced myself to the patient as a Secondary school teacher and provided education on APPs in clinical practice.    Chief Complaint  Patient presents with   URI    Pt states she has been having a cough, congestion, sinus pressure, headache, sore throat, body aches, and chills for the last 2 days.    Subjective    HPI HPI     URI    Additional comments: Pt states she has been having a cough, congestion, sinus pressure, headache, sore throat, body aches, and chills for the last 2 days.       Last edited by Pablo Ledger, CMA on 08/09/2022  1:57 PM.       URI-Type symptoms  Onset: sudden  Duration: 2 days She reports productive cough, sinus congestion, fatigue, sore throat, body aches, chills, cold sweats  Interventions: Dayquil/Nyquil and Alkaseltzer  Recent sick contacts: none Recent travel: None   COVID testing at home; None    Medications: Outpatient Medications Prior to Visit  Medication Sig   Calcium Carb-Cholecalciferol (CALTRATE 600+D3) 600-800 MG-UNIT TABS Take by mouth.   Iron-Vitamin C 65-125 MG TABS Take 1 tablet by mouth daily at 2 PM.   tamoxifen (NOLVADEX) 20 MG tablet TAKE 1 TABLET DAILY   VITAMIN E PO Take by mouth.   No facility-administered medications prior to visit.    Review of Systems  Constitutional:  Positive for chills and fatigue. Negative for fever.  HENT:  Positive for congestion, postnasal drip, rhinorrhea, sinus pressure and sore throat. Negative for ear pain.   Eyes:  Positive for discharge and itching. Negative for photophobia and visual disturbance.  Respiratory:  Positive for cough. Negative for shortness of breath and wheezing.   Gastrointestinal:  Negative for diarrhea, nausea and vomiting.  Musculoskeletal:  Positive for myalgias.  Neurological:   Negative for dizziness and headaches.       Objective    BP 104/70   Pulse 89   Temp 98.1 F (36.7 C) (Oral)   Wt 151 lb 12.8 oz (68.9 kg)   SpO2 97%   BMI 27.76 kg/m    Physical Exam Vitals reviewed.  Constitutional:      General: She is awake.     Appearance: Normal appearance. She is well-developed and well-groomed.  HENT:     Head: Normocephalic and atraumatic.     Right Ear: Hearing and ear canal normal. A middle ear effusion is present. Tympanic membrane is erythematous. Tympanic membrane is not injected, scarred, perforated, retracted or bulging.     Left Ear: Hearing, tympanic membrane and ear canal normal.     Nose: Nose normal.     Mouth/Throat:     Lips: Pink.     Mouth: Mucous membranes are moist. No oral lesions.     Pharynx: Oropharynx is clear. Uvula midline. No oropharyngeal exudate or posterior oropharyngeal erythema.     Tonsils: No tonsillar exudate or tonsillar abscesses.  Eyes:     General: Lids are normal. Gaze aligned appropriately.     Extraocular Movements: Extraocular movements intact.     Conjunctiva/sclera: Conjunctivae normal.     Pupils: Pupils are equal, round, and reactive to light.  Cardiovascular:     Rate and Rhythm: Normal rate and regular rhythm.  Pulses: Normal pulses.     Heart sounds: Normal heart sounds. No murmur heard.    No friction rub. No gallop.  Pulmonary:     Effort: Pulmonary effort is normal.     Breath sounds: Normal breath sounds. No decreased air movement. No decreased breath sounds, wheezing, rhonchi or rales.  Musculoskeletal:     Cervical back: Normal range of motion and neck supple.     Right lower leg: No edema.     Left lower leg: No edema.  Lymphadenopathy:     Head:     Right side of head: No submental, submandibular or preauricular adenopathy.     Left side of head: No submental, submandibular or preauricular adenopathy.     Cervical:     Right cervical: No superficial or posterior cervical  adenopathy.    Left cervical: No superficial or posterior cervical adenopathy.     Upper Body:     Right upper body: No supraclavicular adenopathy.     Left upper body: No supraclavicular adenopathy.  Skin:    General: Skin is warm and dry.  Neurological:     General: No focal deficit present.     Mental Status: She is alert and oriented to person, place, and time. Mental status is at baseline.  Psychiatric:        Mood and Affect: Mood normal.        Behavior: Behavior normal. Behavior is cooperative.        Thought Content: Thought content normal.        Judgment: Judgment normal.       No results found for any visits on 08/09/22.  Assessment & Plan      No follow-ups on file.     Problem List Items Addressed This Visit   None Visit Diagnoses     Viral upper respiratory tract infection    -  Primary Visit with patient indicates symptoms comprised of nasal congestion, productive coughing, fatigue, chills, myalgias for 2 days  congruent with acute URI that is likely viral in nature  COVID, Flu and rapid strep testing completed today- Rapid strep and flu testing were negative- results were reviewed with patient during apt  COVID and strep culture results pending- results to dictate further management  Due to nature and duration of symptoms recommended treatment regimen is symptomatic relief and follow up if needed Discussed with patient the various viral and bacterial etiologies of current illness and appropriate course of treatment Discussed OTC medication options for multisymptom relief such as Dayquil/Nyquil, Theraflu, AlkaSeltzer, etc. Will send in script for Tessalon pearls to assist with coughing  Discussed return precautions if symptoms are not improving or worsen over next 5-7 days.     Acute cough       Relevant Medications   benzonatate (TESSALON) 100 MG capsule   Other Relevant Orders   Rapid Strep screen([facility])   Veritor Flu A/B Waived   Novel  Coronavirus, NAA ([facility])        No follow-ups on file.   I, Berlynn Warsame E Glorianna Gott, PA-C, have reviewed all documentation for this visit. The documentation on 08/09/22 for the exam, diagnosis, procedures, and orders are all accurate and complete.   Jacquelin Hawking, MHS, PA-C [facility]

## 2022-08-11 LAB — NOVEL CORONAVIRUS, NAA: SARS-CoV-2, NAA: NOT DETECTED

## 2022-08-11 LAB — VERITOR FLU A/B WAIVED: Influenza B: NEGATIVE

## 2022-08-12 LAB — CULTURE, GROUP A STREP: Strep A Culture: NEGATIVE

## 2022-08-12 LAB — VERITOR FLU A/B WAIVED: Influenza A: NEGATIVE

## 2022-08-12 LAB — RAPID STREP SCREEN (MED CTR MEBANE ONLY): Strep Gp A Ag, IA W/Reflex: NEGATIVE

## 2022-08-13 NOTE — Progress Notes (Signed)
Your COVID testing was negative.  Your strep culture was negative as well.  Please proceed with the recommendations discussed during your apt.

## 2022-08-30 ENCOUNTER — Telehealth: Payer: Self-pay | Admitting: Internal Medicine

## 2022-08-30 ENCOUNTER — Ambulatory Visit: Payer: Self-pay

## 2022-08-30 NOTE — Telephone Encounter (Signed)
Patient will need apt for form completion either with me or her PCP, [name].

## 2022-08-30 NOTE — Telephone Encounter (Signed)
  Chief Complaint: Lingering cough Symptoms: Cough with wetness in chest Frequency: since June Pertinent Negatives: Patient denies  Disposition: [] ED /[] Urgent Care (no appt availability in office) / [] Appointment(In office/virtual)/ []  [place] [facility]/ [] Home Care/ [] Refused Recommended Disposition /[] [place] [facility]/ [x]  Follow-up with PCP Additional Notes: Pt was seen in June, and cough has not resolved. Pt was given tessalon pearls, which provided mild relief. Pt states that she can feel wetness in her chest. She would like additional cough medicine called in. Pt is willing to use OTC medication, but is concerned about any interactions with Breast CA medications she is taking. Please advise.    Summary: cough   Pt seen Erin Mecum at [facility] on 08/09/22. Pt states that she is still having a cough and has taken all of the medication that was prescribed for her. Pt is wanting to know if something else could be called in for her or if she can take something over the counter. Please advise.     Reason for Disposition  Cough has been present for > 3 weeks  Answer Assessment - Initial Assessment Questions 1. ONSET: "When did the cough begin?"      June 2. SEVERITY: "How bad is the cough today?"      moderate 3. SPUTUM: "Describe the color of your sputum" (none, dry cough; clear, white, yellow, green)      4. HEMOPTYSIS: "Are you coughing up any blood?" If so ask: "How much?" (flecks, streaks, tablespoons, etc.)      5. DIFFICULTY BREATHING: "Are you having difficulty breathing?" If Yes, ask: "How bad is it?" (e.g., mild, moderate, severe)    - MILD: No SOB at rest, mild SOB with walking, speaks normally in sentences, can lie down, no retractions, pulse < 100.    - MODERATE: SOB at rest, SOB with minimal exertion and prefers to sit, cannot lie down flat, speaks in phrases, mild retractions, audible wheezing, pulse 100-120.    - SEVERE: Very SOB at rest, speaks in single words,  struggling to breathe, sitting hunched forward, retractions, pulse > 120      No - barking cough at night 6. FEVER: "Do you have a fever?" If Yes, ask: "What is your temperature, how was it measured, and when did it start?"     no 7. CARDIAC HISTORY: "Do you have any history of heart disease?" (e.g., heart attack, congestive heart failure)      no 8. LUNG HISTORY: "Do you have any history of lung disease?"  (e.g., pulmonary embolus, asthma, emphysema)     no 9. PE RISK FACTORS: "Do you have a history of blood clots?" (or: recent major surgery, recent prolonged travel, bedridden)     no 10. OTHER SYMPTOMS: "Do you have any other symptoms?" (e.g., runny nose, wheezing, chest pain)       Feels wetness in chest  Protocols used: Cough - Acute Non-Productive-A-AH

## 2022-08-30 NOTE — Telephone Encounter (Signed)
FYI- -Pt was seen here with Erin.

## 2022-08-30 NOTE — Telephone Encounter (Signed)
She was not seen by me so she will need to make an appointment for follow-up if she would like prescription cough suppressant.  My recommendation would be to take Delsym OTC for cough.  This should not interact with any of the her medications that she is taking.

## 2022-08-30 NOTE — Telephone Encounter (Signed)
Copied from CRM [id number]. Topic: General - Other >> Aug 30, 2022  1:42 PM Dondra Prader A wrote: Reason for CRM: Pt states that she seen Erin Mecum at [facility] on 08/09/22 and was told by Denny Peon to stay out of work for 4 days and gave her a doctors note for work. Pt states that [employer] [facility] does not accept doctors notes, they are needing supporting documentation as well. [employer] [facility] did send over a form for Erin Mecum to fill out and will need this done as soon as possible so that her denial can be disputed. I did advise pt that it is process, pt would like a call back to discuss.

## 2022-08-30 NOTE — Telephone Encounter (Signed)
LMTCB 08/30/2022.  PEC please advise pt as below when she calls back.   Thanks,   -Vernona Rieger

## 2022-08-31 NOTE — Telephone Encounter (Signed)
Called patient to inform here that she will need an appointment.She explained that the 1-3 questions needed to be answered on the form  she will bring to the office and if provider still needs to see her an appointment  has been made. Patient expressed understanding.

## 2022-09-10 ENCOUNTER — Ambulatory Visit (INDEPENDENT_AMBULATORY_CARE_PROVIDER_SITE_OTHER): Payer: BC Managed Care – PPO | Admitting: Physician Assistant

## 2022-09-10 ENCOUNTER — Encounter: Payer: Self-pay | Admitting: Physician Assistant

## 2022-09-10 VITALS — BP 103/72 | HR 68 | Wt 150.4 lb

## 2022-09-10 DIAGNOSIS — Z0289 Encounter for other administrative examinations: Secondary | ICD-10-CM | POA: Diagnosis not present

## 2022-09-10 DIAGNOSIS — R058 Other specified cough: Secondary | ICD-10-CM | POA: Diagnosis not present

## 2022-09-10 NOTE — Patient Instructions (Signed)
  I also recommend adding an antihistamine to your daily regimen  This includes medications like Claritin, Allegra, Zyrtec- the generics of these work very well and are usually less expensive  I recommend using Flonase nasal spray - 2 puffs twice per day to help with your nasal congestion The antihistamines and Flonase can take a few weeks to provide significant relief from allergy symptoms but should start to provide some benefit soon.  

## 2022-09-10 NOTE — Progress Notes (Signed)
Acute Office Visit   Patient: Annette Le   DOB: 1971/01/10   52 y.o. Female  MRN: [id number] Visit Date: 09/10/2022  Today's healthcare provider: Oswaldo Conroy Alisse Tuite, PA-C  Introduced myself to the patient as a Secondary school teacher and provided education on APPs in clinical practice.    Chief Complaint  Patient presents with   Form Completion    Patient says she is here to have a form completed for her insurance.   Cough    Patient says she is still experiencing the cough that she has back at her visit in June. Patient says she has tried over the [employer], but that's all she has tried.    Subjective    HPI HPI     Form Completion    Additional comments: Patient says she is here to have a form completed for her insurance.        Cough    Additional comments: Patient says she is still experiencing the cough that she has back at her visit in June. Patient says she has tried over the [employer], but that's all she has tried.       Last edited by Malen Gauze, CMA on 09/10/2022 10:14 AM.        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   Iron-Vitamin C 65-125 MG TABS Take 1 tablet by mouth daily at 2 PM.   tamoxifen (NOLVADEX) 20 MG tablet TAKE 1 TABLET DAILY   VITAMIN E PO Take by mouth.   benzonatate (TESSALON) 100 MG capsule Take 1 capsule (100 mg total) by mouth 2 (two) times daily as needed for cough. (Patient not taking: Reported on 09/10/2022)   No facility-administered medications prior to visit.    Review of Systems  Constitutional:  Negative for chills, fatigue and fever.   Respiratory:  Positive for cough. Negative for chest tightness, shortness of breath and wheezing.          Objective    BP 103/72   Pulse 68   Wt 150 lb 6.4 oz (68.2 kg)   SpO2 98%   BMI 27.51 kg/m      Physical Exam Vitals reviewed.  Constitutional:      General: She is awake.     Appearance: Normal appearance. She is well-developed and well-groomed.  HENT:     Head: Normocephalic and atraumatic.  Eyes:     General: Lids are normal. Gaze aligned appropriately.  Cardiovascular:     Rate and Rhythm: Normal rate and regular rhythm.     Pulses: Normal pulses.     Heart sounds: Normal heart sounds.  Pulmonary:     Effort: Pulmonary effort is normal.     Breath sounds: Normal breath sounds. No decreased air movement. No decreased breath sounds, wheezing, rhonchi or rales.  Musculoskeletal:     Cervical back: Normal range of motion.     Right lower leg: No edema.     Left lower leg: No edema.  Neurological:     Mental Status: She is alert.  Psychiatric:  Attention and Perception: Attention and perception normal.        Mood and Affect: Mood and affect normal.        Speech: Speech normal.        Behavior: Behavior normal. Behavior is cooperative.       No results found for any visits on 09/10/22.  Assessment & Plan      No follow-ups on file.     Problem List Items Addressed This Visit   None Visit Diagnoses     Post-viral cough syndrome    -  Primary Acute, ongoing concern Patient was previously seen approximately 1 month ago for suspected viral URI Symptoms appear to have resolved with the exception of persistent cough that seems to be triggered with exposure to cold environments Given her symptoms I suspect that this is postviral cough syndrome Recommend that she continues to use over-the-counter Robitussin as well as add on a daily antihistamine to assist with further symptoms Follow-up as needed for persistent or progressing symptoms    Encounter for completion of form with patient     Patient's employer is requesting FMLA paperwork for her absence surrounding her URI for which she was seen on 08/09/2022 Paperwork was filled out today-please see scanned documents for reference Paperwork was returned to patient per preference         No follow-ups on file.   I, Kobe Ofallon E Miah Boye, PA-C, have reviewed all documentation for this visit. The documentation on 09/10/22 for the exam, diagnosis, procedures, and orders are all accurate and complete.   Jacquelin Hawking, MHS, PA-C [facility]

## 2022-10-18 ENCOUNTER — Ambulatory Visit
Admission: RE | Admit: 2022-10-18 | Discharge: 2022-10-18 | Disposition: A | Discharge status: Home / Self Care | Payer: BC Managed Care – PPO | Source: Ambulatory Visit | Attending: Radiation Oncology | Admitting: Radiation Oncology

## 2022-10-18 ENCOUNTER — Encounter: Payer: Self-pay | Admitting: Radiation Oncology

## 2022-10-18 VITALS — BP 115/76 | HR 78 | Temp 98.4°F | Resp 16 | Wt 152.0 lb

## 2022-10-18 DIAGNOSIS — Z79810 Long term (current) use of selective estrogen receptor modulators (SERMs): Secondary | ICD-10-CM | POA: Insufficient documentation

## 2022-10-18 DIAGNOSIS — Z923 Personal history of irradiation: Secondary | ICD-10-CM | POA: Diagnosis not present

## 2022-10-18 DIAGNOSIS — C50411 Malignant neoplasm of upper-outer quadrant of right female breast: Secondary | ICD-10-CM | POA: Diagnosis not present

## 2022-10-18 DIAGNOSIS — Z170 Estrogen receptor positive status [ER+]: Secondary | ICD-10-CM | POA: Insufficient documentation

## 2022-10-18 NOTE — Progress Notes (Signed)
Radiation Oncology Follow up Note  Name: Annette Le   Date:   10/18/2022 MRN:  [id number] DOB: [birth year]    This 52 y.o. female presents to the clinic today for 2-year follow-up status post whole breast radiation to her right breast for stage Ia mucinous carcinoma ER/PR positive.  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  Seen today in routine follow-up she is doing well.  She specifically denies breast tenderness cough or bone pain..  She is currently on tamoxifen tolerating that well without side effect.  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  COMPLICATIONS OF TREATMENT: none  FOLLOW UP COMPLIANCE: keeps appointments   PHYSICAL EXAM:  BP 115/76   Pulse 78   Temp 98.4 F (36.9 C) (Tympanic)   Resp 16   Wt 152 lb (68.9 kg)   BMI 27.80 kg/m  Lungs are clear to A&P cardiac examination essentially unremarkable with regular rate and rhythm. No dominant mass or nodularity is noted in either breast in 2 positions examined. Incision is well-healed. No axillary or supraclavicular adenopathy is appreciated. Cosmetic result is excellent.  Well-developed well-nourished patient in NAD. HEENT reveals PERLA, EOMI, discs not visualized.  Oral cavity is clear. No oral mucosal lesions are identified. Neck is clear without evidence of cervical or supraclavicular adenopathy. Lungs are clear to A&P. Cardiac examination is essentially unremarkable with regular rate and rhythm without murmur rub or thrill. Abdomen is benign with no organomegaly or masses noted. Motor sensory and DTR levels are equal and symmetric in the upper and lower extremities. Cranial nerves II through XII are  grossly intact. Proprioception is intact. No peripheral adenopathy or edema is identified. No motor or sensory levels are noted. Crude visual fields are within normal range.  RADIOLOGY RESULTS: Mammograms and pathology reviewed compatible with above-stated findings  PLAN: Present time patient continues to do well now out 2 years status post whole breast radiation and pleased with her overall progress.  I have asked to see her back in 1 year and then will discontinue follow-up care.  Patient knows to call with any concerns.  I would like to take this opportunity to thank you for allowing me to participate in the care of your patient.Carmina Miller, MD

## 2022-10-22 ENCOUNTER — Other Ambulatory Visit: Payer: Self-pay | Admitting: Oncology

## 2022-11-19 ENCOUNTER — Inpatient Hospital Stay (HOSPITAL_BASED_OUTPATIENT_CLINIC_OR_DEPARTMENT_OTHER): Payer: BC Managed Care – PPO | Admitting: Oncology

## 2022-11-19 ENCOUNTER — Inpatient Hospital Stay: Payer: BC Managed Care – PPO | Attending: Oncology

## 2022-11-19 ENCOUNTER — Encounter: Payer: Self-pay | Admitting: Oncology

## 2022-11-19 VITALS — BP 114/76 | HR 73 | Temp 98.4°F | Resp 18 | Wt 153.1 lb

## 2022-11-19 DIAGNOSIS — D508 Other iron deficiency anemias: Secondary | ICD-10-CM | POA: Diagnosis not present

## 2022-11-19 DIAGNOSIS — Z79810 Long term (current) use of selective estrogen receptor modulators (SERMs): Secondary | ICD-10-CM | POA: Diagnosis not present

## 2022-11-19 DIAGNOSIS — Z170 Estrogen receptor positive status [ER+]: Secondary | ICD-10-CM | POA: Diagnosis not present

## 2022-11-19 DIAGNOSIS — D509 Iron deficiency anemia, unspecified: Secondary | ICD-10-CM | POA: Insufficient documentation

## 2022-11-19 DIAGNOSIS — C50411 Malignant neoplasm of upper-outer quadrant of right female breast: Secondary | ICD-10-CM | POA: Insufficient documentation

## 2022-11-19 DIAGNOSIS — C50919 Malignant neoplasm of unspecified site of unspecified female breast: Secondary | ICD-10-CM

## 2022-11-19 LAB — IRON AND TIBC
Iron: 52 ug/dL (ref 28–170)
Saturation Ratios: 11 % (ref 10.4–31.8)
TIBC: 487 ug/dL — ABNORMAL HIGH (ref 250–450)
UIBC: 435 ug/dL

## 2022-11-19 LAB — CBC WITH DIFFERENTIAL (CANCER CENTER ONLY)
Abs Immature Granulocytes: 0.02 10*3/uL (ref 0.00–0.07)
Basophils Absolute: 0.1 10*3/uL (ref 0.0–0.1)
Basophils Relative: 1 %
Eosinophils Absolute: 0.5 10*3/uL (ref 0.0–0.5)
Eosinophils Relative: 8 %
HCT: 37 % (ref 36.0–46.0)
Hemoglobin: 11.9 g/dL — ABNORMAL LOW (ref 12.0–15.0)
Immature Granulocytes: 0 %
Lymphocytes Relative: 32 %
Lymphs Abs: 2 10*3/uL (ref 0.7–4.0)
MCH: 27 pg (ref 26.0–34.0)
MCHC: 32.2 g/dL (ref 30.0–36.0)
MCV: 83.9 fL (ref 80.0–100.0)
Monocytes Absolute: 0.5 10*3/uL (ref 0.1–1.0)
Monocytes Relative: 8 %
Neutro Abs: 3.2 10*3/uL (ref 1.7–7.7)
Neutrophils Relative %: 51 %
Platelet Count: 217 10*3/uL (ref 150–400)
RBC: 4.41 MIL/uL (ref 3.87–5.11)
RDW: 14.1 % (ref 11.5–15.5)
WBC Count: 6.4 10*3/uL (ref 4.0–10.5)
nRBC: 0 % (ref 0.0–0.2)

## 2022-11-19 LAB — CMP (CANCER CENTER ONLY)
ALT: 33 U/L (ref 0–44)
AST: 28 U/L (ref 15–41)
Albumin: 3.5 g/dL (ref 3.5–5.0)
Alkaline Phosphatase: 61 U/L (ref 38–126)
Anion gap: 7 (ref 5–15)
BUN: 13 mg/dL (ref 6–20)
CO2: 22 mmol/L (ref 22–32)
Calcium: 8.6 mg/dL — ABNORMAL LOW (ref 8.9–10.3)
Chloride: 108 mmol/L (ref 98–111)
Creatinine: 0.72 mg/dL (ref 0.44–1.00)
GFR, Estimated: >60 mL/min (ref >60)
Glucose, Bld: 136 mg/dL — ABNORMAL HIGH (ref 70–99)
Potassium: 4 mmol/L (ref 3.5–5.1)
Sodium: 137 mmol/L (ref 135–145)
Total Bilirubin: 0.5 mg/dL (ref 0.3–1.2)
Total Protein: 6.7 g/dL (ref 6.5–8.1)

## 2022-11-19 LAB — FERRITIN: Ferritin: 8 ng/mL — ABNORMAL LOW (ref 11–307)

## 2022-11-19 NOTE — Assessment & Plan Note (Addendum)
Iron panel is consistent with IDA Lab Results  Component Value Date   HGB 11.9 (L) 11/19/2022   TIBC 487 (H) 11/19/2022   IRONPCTSAT 11 11/19/2022   FERRITIN 8 (L) 11/19/2022    Recommend patient to take Vitron-C 1 tablet daily.  Prescription was sent to pharmacy.  She has upcoming colonoscopy

## 2022-11-19 NOTE — Progress Notes (Signed)
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 Case was discussed on tumor board on 08/01/2020, per Dr. Oneita Kras, this is a few mucinous carcinoma, favorable histology.Status post adjuvant radiation Labs reviewed and discussed with Continue adjuvant tamoxifen 20 mg daily. annual pelvic examination by gynecologist -  Mammogram annually set up by Surgeon's office.   Patient does not want to take TB treatment currently and prefers to stay on Tamoxifen If she decides to get Rifampin for treatment of TB,i will switch to ovarian suppression + AI.    IDA (iron deficiency anemia) Iron panel is consistent with IDA Lab Results  Component Value Date   HGB 11.9 (L) 11/19/2022   TIBC 487 (H) 11/19/2022   IRONPCTSAT 11 11/19/2022   FERRITIN 8 (L) 11/19/2022    Recommend patient to take Vitron-C 1 tablet daily.  Prescription was sent to pharmacy.  She has upcoming colonoscopy  Hypocalcemia Continue calcium 1200mg  and vitamin D     Orders Placed This Encounter  Procedures   CBC with Differential (Cancer Center Only)    Standing Status:   Future    Standing Expiration Date:   11/19/2023   CMP (Cancer Center only)    Standing Status:   Future    Standing Expiration Date:   11/19/2023   Retic Panel    Standing Status:   Future    Standing Expiration Date:   11/19/2023   Follow up in 6 months All questions were answered. The patient knows to call the clinic with any problems, questions or concerns.  Rickard Patience, MD, PhD [facility] Hematology  Oncology 11/19/2022     HISTORY OF PRESENTING ILLNESS:   Annette Le is a  52 y.o.  female with PMH listed below was seen in consultation at the request of  Lorre Munroe, NP  for evaluation of breast cancer.   She felt a painful right breast nodule for about 6 weeks. Went to see primary care physician and mammogram was obtained.  06/17/2020 bilateral diagnostic mammogram showed 14 x 15 x 12 mm right upper outer breast mass, 11 o'clock 2cm from nipple. close to the site of palpable/painful concern. And 2 other probably benign mass at 12 o'clock.   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  Grade 2, DCIS not identified.  LVI not identified.  Patient is scheduled to have additional ultrasound-guided biopsy of the 2 masses at 12 o'clock position of the right breast. Patient presents to establish care discussed.  She has appointment scheduled to see surgery with Dr.Rodenberg.  She was accompanied by her husband.  Family history of breast cancer: Maternal aunt was diagnosed with breast cancer at the age of 52 Family history of other cancers: Maternal aunt stomach cancer, maternal grandfather pancreatic cancer, maternal aunt lung cancer-also a smoker.  Menarche: 52 years of age  premenopausal, LMP 06/22/2020 Number of pregnancies :  Age at first live childbirth: Used OCP: Remote use for a few months. Used estrogen and progesterone therapy: Denies History of Radiation to the  chest: Denies Previous of breast biopsy: Denies  06/29/2020, right breast 12:00 retroareolar ultrasound-guided biopsy showed negative for cancer 07/08/2020, MRI breast bilateral with and without contrast showed known biopsy-proven malignancy over the upper outer quadrant of the right breast.  No additional suspicious masses or abdominal enhancement within either breast. 07/25/2020 underwent right breast lumpectomy and sentinel lymph node  biopsy Pathology showed mucinous carcinoma, 1.5 cm, grade 2, 4 sentinel lymph nodes were excised and 0 was involved with malignancy.  Margins were all negative.  pT1c pN0, Per previous biopsy, ER 90%, PR 90%, HER2 negative by immunohistochemistry Case was discussed on tumor board on 08/01/20 Consensus was reached to proceed with adjuvant radiation followed by antiestrogen treatments.  Family history of cancer, genetic testing is negative.   VUS in POLE and TERT  08/24/2020-09/14/2020 adjuvant radiation 10/05/2020 started on Tamoxifen. Overall she tolerates well.   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  Per Dr.Nunez, she reports possibly being treated for latent TB in [place], and she took medication for 6 months. She was not able to remember the name of the medication. Dr.Nunez feels that she does not need to be treated for latent TB for twice, and treatment is optional.   INTERVAL HISTORY Annette Le is a 52 y.o. female who has above history reviewed by me today presents for follow up visit for management of right breast cancer She report feeling well,  She takes calcium and Vitamin D supplementations. No new breast concerns.     Review of Systems  Constitutional:  Negative for appetite change, chills, fatigue and fever.  HENT:   Negative for hearing loss and voice change.   Eyes:  Negative for eye problems.  Respiratory:  Negative for chest tightness and cough.   Cardiovascular:  Negative for chest pain.  Gastrointestinal:  Negative for abdominal distention, abdominal pain and blood in stool.  Endocrine: Positive for hot flashes.  Genitourinary:  Negative for difficulty urinating and frequency.   Musculoskeletal:  Negative for arthralgias and back pain.  Skin:  Negative for itching and rash.  Neurological:  Negative for extremity weakness.  Hematological:  Negative for adenopathy.  Psychiatric/Behavioral:  Negative for confusion.      MEDICAL HISTORY:  Past Medical History:  Diagnosis Date   Allergy    Asthma    Carpal tunnel syndrome    Family history of breast cancer    Family history of colon cancer    Family history of pancreatic cancer    Family history of stomach cancer    Knee injury    Personal history of radiation therapy 08/03/2020   Breast    SURGICAL HISTORY: Past Surgical History:  Procedure Laterality Date   BREAST BIOPSY Right 06/22/2020   u/s bx @ 11:00-"vision" clip-INVASIVE MAMMARY CARCINOMA WITH MUCINOUS FEATURES.   BREAST BIOPSY Right 06/29/2020   [place] bx at 12:00, x marker, benign   BREAST BIOPSY Left 07/24/2022   stereo bx, calcs, "RIBBON" clip-path pending   BREAST BIOPSY Left 07/24/2022   MM LT BREAST BX W LOC DEV 1ST LESION IMAGE BX SPEC STEREO GUIDE 07/24/2022 [facility]   BREAST LUMPECTOMY Right 07/15/2020   Mucinous carcinoma   NEG margins   BREAST LUMPECTOMY,RADIO FREQ LOCALIZER,AXILLARY SENTINEL LYMPH NODE BIOPSY Right 07/25/2020   Procedure: BREAST LUMPECTOMY,RADIO FREQ LOCALIZER,AXILLARY SENTINEL LYMPH NODE BIOPSY;  Surgeon: Campbell Lerner, MD;  Location: [facility] ORS;  Service: General;  Laterality: Right;    SOCIAL HISTORY: Social History  Socioeconomic History   Marital status: Married    Spouse name: Not on file   Number of children: Not on file   Years of education: Not on file   Highest education level: Some college, no degree  Occupational History   Not on file  Tobacco Use   Smoking status: Former    Current packs/day: 0.00    Types: Cigarettes    Quit date: 10/11/2003    Years since quitting: 19.1   Smokeless tobacco: Never  Vaping Use   Vaping status: Never Used  Substance and Sexual Activity   Alcohol use: Yes    Comment: occ   Drug use: Never   Sexual activity: Not on file  Other Topics Concern   Not on file  Social History Narrative   Not on file   Social Determinants of Health   Financial Resource Strain: Low Risk  (08/09/2022)    Overall Financial Resource Strain (CARDIA)    Difficulty of Paying Living Expenses: Not very hard  Food Insecurity: No Food Insecurity (08/09/2022)   Hunger Vital Sign    Worried About Running Out of Food in the Last Year: Never true    Ran Out of Food in the Last Year: Never true  Transportation Needs: No Transportation Needs (08/09/2022)   PRAPARE - Administrator, Civil Service (Medical): No    Lack of Transportation (Non-Medical): No  Physical Activity: Sufficiently Active (08/09/2022)   Exercise Vital Sign    Days of Exercise per Week: 7 days    Minutes of Exercise per Session: 150+ min  Stress: No Stress Concern Present (08/09/2022)   [employer] of Occupational Health - Occupational Stress Questionnaire    Feeling of Stress : Only a little  Social Connections: Moderately Integrated (08/09/2022)   Social Connection and Isolation Panel [NHANES]    Frequency of Communication with Friends and Family: Once a week    Frequency of Social Gatherings with Friends and Family: Once a week    Attends Religious Services: More than 4 times per year    Active Member of [employer] or Organizations: Yes    Attends Engineer, structural: More than 4 times per year    Marital Status: Married  Catering manager Violence: Not on file    FAMILY HISTORY: Family History  Problem Relation Age of Onset   Colon cancer Maternal Aunt    Lung cancer Maternal Aunt    Stomach cancer Maternal Aunt    Cancer Father 31       intestinal vs. stomach   Pancreatic cancer Maternal Grandfather        d. 7s   Breast cancer Cousin     ALLERGIES:  is allergic to shrimp extract, ibuprofen, shrimp [shellfish allergy], tylenol [acetaminophen], and other.  MEDICATIONS:  Current Outpatient Medications  Medication Sig Dispense Refill   Calcium Carb-Cholecalciferol (CALTRATE 600+D3) 600-800 MG-UNIT TABS Take by mouth.     Iron-Vitamin C 65-125 MG TABS Take 1 tablet by mouth daily at 2 PM. 30 tablet  3   tamoxifen (NOLVADEX) 20 MG tablet TAKE 1 TABLET DAILY 90 tablet 3   VITAMIN E PO Take by mouth.     benzonatate (TESSALON) 100 MG capsule Take 1 capsule (100 mg total) by mouth 2 (two) times daily as needed for cough. (Patient not taking: Reported on 09/10/2022) 20 capsule 0   No current facility-administered medications for this visit.     PHYSICAL EXAMINATION: ECOG PERFORMANCE STATUS: 0 - Asymptomatic Vitals:  11/19/22 1345  BP: 114/76  Pulse: 73  Resp: 18  Temp: 98.4 F (36.9 C)   Filed Weights   11/19/22 1345  Weight: 153 lb 1.6 oz (69.4 kg)    Physical Exam Constitutional:      General: She is not in acute distress. HENT:     Head: Normocephalic and atraumatic.  Eyes:     General: No scleral icterus. Cardiovascular:     Rate and Rhythm: Normal rate and regular rhythm.     Heart sounds: Normal heart sounds.  Pulmonary:     Effort: Pulmonary effort is normal. No respiratory distress.     Breath sounds: No wheezing.  Abdominal:     General: Bowel sounds are normal. There is no distension.     Palpations: Abdomen is soft.  Musculoskeletal:        General: No deformity. Normal range of motion.     Cervical back: Normal range of motion and neck supple.  Skin:    General: Skin is warm and dry.     Findings: No erythema or rash.  Neurological:     Mental Status: She is alert and oriented to person, place, and time. Mental status is at baseline.     Cranial Nerves: No cranial nerve deficit.     Coordination: Coordination normal.  Psychiatric:        Mood and Affect: Mood normal.     LABORATORY DATA:  I have reviewed the data as listed    Latest Ref Rng & Units 11/19/2022    1:17 PM 05/17/2022    2:28 PM 11/16/2021    1:26 PM  CBC  WBC 4.0 - 10.5 K/uL 6.4  6.6  5.1   Hemoglobin 12.0 - 15.0 g/dL 16.1  09.6  04.5   Hematocrit 36.0 - 46.0 % 37.0  35.0  39.6   Platelets 150 - 400 K/uL 217  236  236       Latest Ref Rng & Units 11/19/2022    1:17 PM  05/17/2022    3:19 PM 05/17/2022    2:28 PM  CMP  Glucose 70 - 99 mg/dL 409   811   BUN 6 - 20 mg/dL 13   12   Creatinine 9.14 - 1.00 mg/dL 7.82   9.56   Sodium 213 - 145 mmol/L 137   138   Potassium 3.5 - 5.1 mmol/L 4.0   3.5   Chloride 98 - 111 mmol/L 108   110   CO2 22 - 32 mmol/L 22   23   Calcium 8.9 - 10.3 mg/dL 8.6  9.1  8.1   Total Protein 6.5 - 8.1 g/dL 6.7   7.1   Total Bilirubin 0.3 - 1.2 mg/dL 0.5   0.4   Alkaline Phos 38 - 126 U/L 61   63   AST 15 - 41 U/L 28   36   ALT 0 - 44 U/L 33   39      RADIOGRAPHIC STUDIES: I have personally reviewed the radiological images as listed and agreed with the findings in the report. No results found.

## 2022-11-19 NOTE — Assessment & Plan Note (Addendum)
Right breast Stage 1A mucinous carcinoma. Case was discussed on tumor board on 08/01/2020, per Dr. Oneita Kras, this is a few mucinous carcinoma, favorable histology.Status post adjuvant radiation Labs reviewed and discussed with Continue adjuvant tamoxifen 20 mg daily. annual pelvic examination by gynecologist -  Mammogram annually set up by Surgeon's office.   Patient does not want to take TB treatment currently and prefers to stay on Tamoxifen If she decides to get Rifampin for treatment of TB,i will switch to ovarian suppression + AI.

## 2022-11-19 NOTE — Assessment & Plan Note (Addendum)
Continue calcium 1200mg  and vitamin D

## 2022-11-19 NOTE — Progress Notes (Signed)
Pt here for follow up. No new problems or concerns

## 2023-05-20 ENCOUNTER — Inpatient Hospital Stay: Payer: BC Managed Care – PPO | Attending: Oncology

## 2023-05-20 ENCOUNTER — Other Ambulatory Visit: Payer: Self-pay

## 2023-05-20 ENCOUNTER — Inpatient Hospital Stay (HOSPITAL_BASED_OUTPATIENT_CLINIC_OR_DEPARTMENT_OTHER): Payer: BC Managed Care – PPO | Admitting: Oncology

## 2023-05-20 ENCOUNTER — Encounter: Payer: Self-pay | Admitting: Oncology

## 2023-05-20 ENCOUNTER — Telehealth: Payer: Self-pay

## 2023-05-20 VITALS — BP 119/81 | HR 75 | Temp 97.8°F | Resp 18 | Wt 157.9 lb

## 2023-05-20 DIAGNOSIS — Z170 Estrogen receptor positive status [ER+]: Secondary | ICD-10-CM | POA: Diagnosis not present

## 2023-05-20 DIAGNOSIS — D509 Iron deficiency anemia, unspecified: Secondary | ICD-10-CM | POA: Insufficient documentation

## 2023-05-20 DIAGNOSIS — C50919 Malignant neoplasm of unspecified site of unspecified female breast: Secondary | ICD-10-CM | POA: Diagnosis not present

## 2023-05-20 DIAGNOSIS — C50411 Malignant neoplasm of upper-outer quadrant of right female breast: Secondary | ICD-10-CM | POA: Insufficient documentation

## 2023-05-20 DIAGNOSIS — Z79810 Long term (current) use of selective estrogen receptor modulators (SERMs): Secondary | ICD-10-CM | POA: Diagnosis not present

## 2023-05-20 DIAGNOSIS — D508 Other iron deficiency anemias: Secondary | ICD-10-CM

## 2023-05-20 DIAGNOSIS — R7401 Elevation of levels of liver transaminase levels: Secondary | ICD-10-CM | POA: Insufficient documentation

## 2023-05-20 LAB — FERRITIN: Ferritin: 109 ng/mL (ref 11–307)

## 2023-05-20 LAB — IRON AND TIBC
Iron: 72 ug/dL (ref 28–170)
Saturation Ratios: 17 % (ref 10.4–31.8)
TIBC: 427 ug/dL (ref 250–450)
UIBC: 355 ug/dL

## 2023-05-20 LAB — CBC WITH DIFFERENTIAL (CANCER CENTER ONLY)
Abs Immature Granulocytes: 0.02 10*3/uL (ref 0.00–0.07)
Basophils Absolute: 0.1 10*3/uL (ref 0.0–0.1)
Basophils Relative: 1 %
Eosinophils Absolute: 0.4 10*3/uL (ref 0.0–0.5)
Eosinophils Relative: 6 %
HCT: 41.5 % (ref 36.0–46.0)
Hemoglobin: 13.6 g/dL (ref 12.0–15.0)
Immature Granulocytes: 0 %
Lymphocytes Relative: 39 %
Lymphs Abs: 2.4 10*3/uL (ref 0.7–4.0)
MCH: 28.8 pg (ref 26.0–34.0)
MCHC: 32.8 g/dL (ref 30.0–36.0)
MCV: 87.9 fL (ref 80.0–100.0)
Monocytes Absolute: 0.4 10*3/uL (ref 0.1–1.0)
Monocytes Relative: 6 %
Neutro Abs: 3 10*3/uL (ref 1.7–7.7)
Neutrophils Relative %: 48 %
Platelet Count: 201 10*3/uL (ref 150–400)
RBC: 4.72 MIL/uL (ref 3.87–5.11)
RDW: 13.7 % (ref 11.5–15.5)
WBC Count: 6.2 10*3/uL (ref 4.0–10.5)
nRBC: 0 % (ref 0.0–0.2)

## 2023-05-20 LAB — CMP (CANCER CENTER ONLY)
ALT: 79 U/L — ABNORMAL HIGH (ref 0–44)
AST: 53 U/L — ABNORMAL HIGH (ref 15–41)
Albumin: 4 g/dL (ref 3.5–5.0)
Alkaline Phosphatase: 87 U/L (ref 38–126)
Anion gap: 8 (ref 5–15)
BUN: 13 mg/dL (ref 6–20)
CO2: 25 mmol/L (ref 22–32)
Calcium: 9.2 mg/dL (ref 8.9–10.3)
Chloride: 107 mmol/L (ref 98–111)
Creatinine: 0.69 mg/dL (ref 0.44–1.00)
GFR, Estimated: >60 mL/min (ref >60)
Glucose, Bld: 137 mg/dL — ABNORMAL HIGH (ref 70–99)
Potassium: 4 mmol/L (ref 3.5–5.1)
Sodium: 140 mmol/L (ref 135–145)
Total Bilirubin: 0.7 mg/dL (ref 0.0–1.2)
Total Protein: 7.3 g/dL (ref 6.5–8.1)

## 2023-05-20 LAB — RETIC PANEL
Immature Retic Fract: 8 % (ref 2.3–15.9)
RBC.: 4.65 MIL/uL (ref 3.87–5.11)
Retic Count, Absolute: 81.4 10*3/uL (ref 19.0–186.0)
Retic Ct Pct: 1.8 % (ref 0.4–3.1)
Reticulocyte Hemoglobin: 32 pg (ref >27.9)

## 2023-05-20 NOTE — Telephone Encounter (Signed)
 Referral for GYN faxed to [facility] to re-establish care for : tamoxifen use/ overdue pelvic exam

## 2023-05-20 NOTE — Progress Notes (Signed)
 Pt here for follow up. No new concerns voiced.

## 2023-05-20 NOTE — Assessment & Plan Note (Signed)
 Check US liver

## 2023-05-20 NOTE — Assessment & Plan Note (Addendum)
 Iron panel is consistent with IDA Lab Results  Component Value Date   HGB 13.6 05/20/2023   TIBC 427 05/20/2023   IRONPCTSAT 17 05/20/2023   FERRITIN 109 05/20/2023    Off Vitron-C 1 tablet currently

## 2023-05-20 NOTE — Assessment & Plan Note (Addendum)
 Right breast Stage 1A mucinous carcinoma. Case was discussed on tumor board on 08/01/2020, per Dr. Oneita Kras, this is a mucinous carcinoma, favorable histology.Status post adjuvant radiation Labs reviewed and discussed with Continue adjuvant tamoxifen 20 mg daily. annual pelvic examination by gynecologist - overdue, refer back to gyn Mammogram annually set up by Surgeon's office.   Patient does not want to take TB treatment currently and prefers to stay on Tamoxifen If she decides to get Rifampin for treatment of TB,i will switch to ovarian suppression + AI.

## 2023-05-20 NOTE — Assessment & Plan Note (Signed)
Continue calcium 1200mg  and vitamin D

## 2023-05-20 NOTE — Progress Notes (Signed)
 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 Case was discussed on tumor board on 08/01/2020, per Dr. Oneita Kras, this is a mucinous carcinoma, favorable histology.Status post adjuvant radiation Labs reviewed and discussed with Continue adjuvant tamoxifen 20 mg daily. annual pelvic examination by gynecologist - overdue, refer back to gyn Mammogram annually set up by Surgeon's office.   Patient does not want to take TB treatment currently and prefers to stay on Tamoxifen If she decides to get Rifampin for treatment of TB,i will switch to ovarian suppression + AI.    Hypocalcemia Continue calcium 1200mg  and vitamin D     IDA (iron deficiency anemia) Iron panel is consistent with IDA Lab Results  Component Value Date   HGB 13.6 05/20/2023   TIBC 427 05/20/2023   IRONPCTSAT 17 05/20/2023   FERRITIN 109 05/20/2023    Off Vitron-C 1 tablet currently   Transaminitis Check US liver  Orders Placed This Encounter  Procedures   CMP (Cancer Center only)    Standing Status:   Future    Expected Date:   11/19/2023    Expiration Date:   05/19/2024   CBC with Differential (Cancer Center Only)    Standing Status:   Future    Expected Date:   11/19/2023    Expiration Date:   05/19/2024   Follicle stimulating hormone    Standing Status:   Future    Expected Date:   11/19/2023    Expiration Date:   05/19/2024   Estradiol    Standing Status:   Future    Expected Date:   11/19/2023    Expiration Date:   05/19/2024   Iron and TIBC    Standing  Status:   Future    Number of Occurrences:   1    Expected Date:   05/20/2023    Expiration Date:   05/19/2024   Ferritin    Standing Status:   Future    Number of Occurrences:   1    Expected Date:   05/20/2023    Expiration Date:   05/19/2024   Follow up in 6 months All questions were answered. The patient knows to call the clinic with any problems, questions or concerns.  Rickard Patience, MD, PhD [facility] Hematology Oncology 05/20/2023     HISTORY OF PRESENTING ILLNESS:   Annette Le is a  53 y.o.  female with PMH listed below was seen in consultation at the request of  Lorre Munroe, NP  for evaluation of breast cancer.   She felt a painful right breast nodule for about 6 weeks. Went to see primary care physician and mammogram was obtained.  06/17/2020 bilateral diagnostic mammogram showed 14 x 15 x 12 mm right upper outer breast mass, 11 o'clock 2cm from nipple. close to the site of palpable/painful concern. And 2 other probably benign mass at 12 o'clock.   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  Grade 2, DCIS not identified.  LVI not identified.  Patient is scheduled to  have additional ultrasound-guided biopsy of the 2 masses at 12 o'clock position of the right breast. Patient presents to establish care discussed.  She has appointment scheduled to see surgery with Dr.Rodenberg.  She was accompanied by her husband.  Family history of breast cancer: Maternal aunt was diagnosed with breast cancer at the age of 73 Family history of other cancers: Maternal aunt stomach cancer, maternal grandfather pancreatic cancer, maternal aunt lung cancer-also a smoker.  Menarche: 53 years of age  premenopausal, LMP 06/22/2020 Number of pregnancies :  Age at first live childbirth: Used OCP: Remote use for a few months. Used estrogen and progesterone therapy: Denies History of Radiation to the chest: Denies Previous  of breast biopsy: Denies  06/29/2020, right breast 12:00 retroareolar ultrasound-guided biopsy showed negative for cancer 07/08/2020, MRI breast bilateral with and without contrast showed known biopsy-proven malignancy over the upper outer quadrant of the right breast.  No additional suspicious masses or abdominal enhancement within either breast. 07/25/2020 underwent right breast lumpectomy and sentinel lymph node biopsy Pathology showed mucinous carcinoma, 1.5 cm, grade 2, 4 sentinel lymph nodes were excised and 0 was involved with malignancy.  Margins were all negative.  pT1c pN0, Per previous biopsy, ER 90%, PR 90%, HER2 negative by immunohistochemistry Case was discussed on tumor board on 08/01/20 Consensus was reached to proceed with adjuvant radiation followed by antiestrogen treatments.  Family history of cancer, genetic testing is negative.   VUS in POLE and TERT  08/24/2020-09/14/2020 adjuvant radiation 10/05/2020 started on Tamoxifen. Overall she tolerates well.   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  Per Dr.Nunez, she reports possibly being treated for latent TB in [place], and she took medication for 6 months. She was not able to remember the name of the medication. Dr.Nunez feels that she does not need to be treated for latent TB for twice, and treatment is optional.   INTERVAL HISTORY Annette Le is a 53 y.o. female who has above history reviewed by me today presents for follow up visit for management of right breast cancer She report feeling well,  She takes calcium and Vitamin D supplementations. No new breast concerns.  She does not take Vitron C currently.     Review of Systems  Constitutional:  Negative for appetite change, chills, fatigue and fever.  HENT:   Negative for hearing loss and voice change.   Eyes:  Negative for eye problems.  Respiratory:  Negative for chest tightness and cough.   Cardiovascular:  Negative for  chest pain.  Gastrointestinal:  Negative for abdominal distention, abdominal pain and blood in stool.  Endocrine: Positive for hot flashes.  Genitourinary:  Negative for difficulty urinating and frequency.   Musculoskeletal:  Negative for arthralgias and back pain.  Skin:  Negative for itching and rash.  Neurological:  Negative for extremity weakness.  Hematological:  Negative for adenopathy.  Psychiatric/Behavioral:  Negative for confusion.     MEDICAL HISTORY:  Past Medical History:  Diagnosis Date   Allergy    Asthma    Carpal tunnel syndrome    Family history of breast cancer    Family history of colon cancer    Family history of pancreatic cancer    Family history of stomach cancer    Knee injury    Personal history of radiation therapy 08/03/2020   Breast    SURGICAL HISTORY: Past Surgical History:  Procedure Laterality Date   BREAST BIOPSY Right 06/22/2020   u/s  bx @ 11:00-"vision" clip-INVASIVE MAMMARY CARCINOMA WITH MUCINOUS FEATURES.   BREAST BIOPSY Right 06/29/2020   [place] bx at 12:00, x marker, benign   BREAST BIOPSY Left 07/24/2022   stereo bx, calcs, "RIBBON" clip-path pending   BREAST BIOPSY Left 07/24/2022   MM LT BREAST BX W LOC DEV 1ST LESION IMAGE BX SPEC STEREO GUIDE 07/24/2022 [facility]   BREAST LUMPECTOMY Right 07/15/2020   Mucinous carcinoma   NEG margins   BREAST LUMPECTOMY,RADIO FREQ LOCALIZER,AXILLARY SENTINEL LYMPH NODE BIOPSY Right 07/25/2020   Procedure: BREAST LUMPECTOMY,RADIO FREQ LOCALIZER,AXILLARY SENTINEL LYMPH NODE BIOPSY;  Surgeon: Campbell Lerner, MD;  Location: [facility];  Service: General;  Laterality: Right;    SOCIAL HISTORY: Social History   Socioeconomic History   Marital status: Married    Spouse name: Not on file   Number of children: Not on file   Years of education: Not on file   Highest education level: Some college, no degree  Occupational History   Not on file  Tobacco Use   Smoking status: Former    Current  packs/day: 0.00    Types: Cigarettes    Quit date: 10/11/2003    Years since quitting: 19.6   Smokeless tobacco: Never  Vaping Use   Vaping status: Never Used  Substance and Sexual Activity   Alcohol use: Yes    Comment: occ   Drug use: Never   Sexual activity: Not on file  Other Topics Concern   Not on file  Social History Narrative   Not on file   Social Drivers of Health   Financial Resource Strain: Low Risk  (08/09/2022)   Overall Financial Resource Strain (CARDIA)    Difficulty of Paying Living Expenses: Not very hard  Food Insecurity: No Food Insecurity (08/09/2022)   Hunger Vital Sign    Worried About Running Out of Food in the Last Year: Never true    Ran Out of Food in the Last Year: Never true  Transportation Needs: No Transportation Needs (08/09/2022)   PRAPARE - Administrator, Civil Service (Medical): No    Lack of Transportation (Non-Medical): No  Physical Activity: Sufficiently Active (08/09/2022)   Exercise Vital Sign    Days of Exercise per Week: 7 days    Minutes of Exercise per Session: 150+ min  Stress: No Stress Concern Present (08/09/2022)   [employer] of Occupational Health - Occupational Stress Questionnaire    Feeling of Stress : Only a little  Social Connections: Moderately Integrated (08/09/2022)   Social Connection and Isolation Panel [NHANES]    Frequency of Communication with Friends and Family: Once a week    Frequency of Social Gatherings with Friends and Family: Once a week    Attends Religious Services: More than 4 times per year    Active Member of [employer] or Organizations: Yes    Attends Engineer, structural: More than 4 times per year    Marital Status: Married  Catering manager Violence: Not on file    FAMILY HISTORY: Family History  Problem Relation Age of Onset   Colon cancer Maternal Aunt    Lung cancer Maternal Aunt    Stomach cancer Maternal Aunt    Cancer Father 71       intestinal vs. stomach    Pancreatic cancer Maternal Grandfather        d. 58s   Breast cancer Cousin     ALLERGIES:  is allergic to shrimp extract, ibuprofen, shrimp [shellfish allergy], tylenol [acetaminophen], and  other.  MEDICATIONS:  Current Outpatient Medications  Medication Sig Dispense Refill   Calcium Carb-Cholecalciferol (CALTRATE 600+D3) 600-800 MG-UNIT TABS Take by mouth.     Iron-Vitamin C 65-125 MG TABS Take 1 tablet by mouth daily at 2 PM. 30 tablet 3   tamoxifen (NOLVADEX) 20 MG tablet TAKE 1 TABLET DAILY 90 tablet 3   VITAMIN E PO Take by mouth.     No current facility-administered medications for this visit.     PHYSICAL EXAMINATION: ECOG PERFORMANCE STATUS: 0 - Asymptomatic Vitals:   05/20/23 1339  BP: 119/81  Pulse: 75  Resp: 18  Temp: 97.8 F (36.6 C)   Filed Weights   05/20/23 1339  Weight: 157 lb 14.4 oz (71.6 kg)    Physical Exam Constitutional:      General: She is not in acute distress. HENT:     Head: Normocephalic and atraumatic.  Eyes:     General: No scleral icterus. Cardiovascular:     Rate and Rhythm: Normal rate and regular rhythm.     Heart sounds: Normal heart sounds.  Pulmonary:     Effort: Pulmonary effort is normal. No respiratory distress.     Breath sounds: No wheezing.  Abdominal:     General: There is no distension.  Musculoskeletal:        General: No deformity. Normal range of motion.     Cervical back: Normal range of motion and neck supple.  Skin:    General: Skin is warm and dry.     Findings: No erythema or rash.  Neurological:     Mental Status: She is alert and oriented to person, place, and time. Mental status is at baseline.  Psychiatric:        Mood and Affect: Mood normal.     LABORATORY DATA:  I have reviewed the data as listed    Latest Ref Rng & Units 05/20/2023    1:28 PM 11/19/2022    1:17 PM 05/17/2022    2:28 PM  CBC  WBC 4.0 - 10.5 K/uL 6.2  6.4  6.6   Hemoglobin 12.0 - 15.0 g/dL 16.1  09.6  04.5   Hematocrit  36.0 - 46.0 % 41.5  37.0  35.0   Platelets 150 - 400 K/uL 201  217  236       Latest Ref Rng & Units 05/20/2023    1:28 PM 11/19/2022    1:17 PM 05/17/2022    3:19 PM  CMP  Glucose 70 - 99 mg/dL 409  811    BUN 6 - 20 mg/dL 13  13    Creatinine 9.14 - 1.00 mg/dL 7.82  9.56    Sodium 213 - 145 mmol/L 140  137    Potassium 3.5 - 5.1 mmol/L 4.0  4.0    Chloride 98 - 111 mmol/L 107  108    CO2 22 - 32 mmol/L 25  22    Calcium 8.9 - 10.3 mg/dL 9.2  8.6  9.1   Total Protein 6.5 - 8.1 g/dL 7.3  6.7    Total Bilirubin 0.0 - 1.2 mg/dL 0.7  0.5    Alkaline Phos 38 - 126 U/L 87  61    AST 15 - 41 U/L 53  28    ALT 0 - 44 U/L 79  33       RADIOGRAPHIC STUDIES: I have personally reviewed the radiological images as listed and agreed with the findings in the report. No results found.

## 2023-05-21 ENCOUNTER — Telehealth: Payer: Self-pay

## 2023-05-21 DIAGNOSIS — R7401 Elevation of levels of liver transaminase levels: Secondary | ICD-10-CM

## 2023-05-21 NOTE — Telephone Encounter (Signed)
-----   Message from Rickard Patience sent at 05/20/2023  8:34 PM EDT ----- She has transaminitis. Please arrange her to get US liver. Thanks.

## 2023-05-21 NOTE — Telephone Encounter (Signed)
 Spoke to pt and informed her of the need for US liver. Pt prefers for [place] to be done Wed 4/9 as she is off of work, if possible. Please schedule and notify pt of appt details.

## 2023-05-29 ENCOUNTER — Ambulatory Visit
Admission: RE | Admit: 2023-05-29 | Discharge: 2023-05-29 | Disposition: A | Discharge status: Home / Self Care | Source: Ambulatory Visit | Attending: Oncology | Admitting: Oncology

## 2023-05-29 DIAGNOSIS — R7401 Elevation of levels of liver transaminase levels: Secondary | ICD-10-CM | POA: Insufficient documentation

## 2023-05-29 DIAGNOSIS — R932 Abnormal findings on diagnostic imaging of liver and biliary tract: Secondary | ICD-10-CM | POA: Diagnosis not present

## 2023-06-03 ENCOUNTER — Encounter: Payer: Self-pay | Admitting: Oncology

## 2023-06-11 NOTE — Telephone Encounter (Signed)
 Pt scheduled to see Dr. Madelene Schanz on 4/23

## 2023-06-12 DIAGNOSIS — N852 Hypertrophy of uterus: Secondary | ICD-10-CM | POA: Diagnosis not present

## 2023-06-12 DIAGNOSIS — Z01411 Encounter for gynecological examination (general) (routine) with abnormal findings: Secondary | ICD-10-CM | POA: Diagnosis not present

## 2023-06-19 DIAGNOSIS — N852 Hypertrophy of uterus: Secondary | ICD-10-CM | POA: Diagnosis not present

## 2023-06-19 DIAGNOSIS — N898 Other specified noninflammatory disorders of vagina: Secondary | ICD-10-CM | POA: Diagnosis not present

## 2023-06-19 DIAGNOSIS — D251 Intramural leiomyoma of uterus: Secondary | ICD-10-CM | POA: Diagnosis not present

## 2023-06-19 DIAGNOSIS — N939 Abnormal uterine and vaginal bleeding, unspecified: Secondary | ICD-10-CM | POA: Diagnosis not present

## 2023-09-16 ENCOUNTER — Other Ambulatory Visit: Payer: Self-pay | Admitting: Oncology

## 2023-09-16 DIAGNOSIS — Z1231 Encounter for screening mammogram for malignant neoplasm of breast: Secondary | ICD-10-CM

## 2023-10-09 ENCOUNTER — Ambulatory Visit
Admission: RE | Admit: 2023-10-09 | Discharge: 2023-10-09 | Disposition: A | Discharge status: Home / Self Care | Source: Ambulatory Visit | Attending: Oncology | Admitting: Oncology

## 2023-10-09 DIAGNOSIS — Z1231 Encounter for screening mammogram for malignant neoplasm of breast: Secondary | ICD-10-CM | POA: Insufficient documentation

## 2023-10-17 ENCOUNTER — Other Ambulatory Visit: Payer: Self-pay | Admitting: Oncology

## 2023-10-17 ENCOUNTER — Ambulatory Visit: Payer: BC Managed Care – PPO | Admitting: Radiation Oncology

## 2023-10-23 ENCOUNTER — Encounter: Payer: Self-pay | Admitting: Radiation Oncology

## 2023-10-23 ENCOUNTER — Ambulatory Visit
Admission: RE | Admit: 2023-10-23 | Discharge: 2023-10-23 | Disposition: A | Discharge status: Home / Self Care | Source: Ambulatory Visit | Attending: Radiation Oncology | Admitting: Radiation Oncology

## 2023-10-23 VITALS — BP 119/84 | HR 91 | Temp 98.3°F | Resp 16 | Wt 156.0 lb

## 2023-10-23 DIAGNOSIS — C50411 Malignant neoplasm of upper-outer quadrant of right female breast: Secondary | ICD-10-CM | POA: Insufficient documentation

## 2023-10-23 DIAGNOSIS — Z79810 Long term (current) use of selective estrogen receptor modulators (SERMs): Secondary | ICD-10-CM | POA: Insufficient documentation

## 2023-10-23 DIAGNOSIS — Z923 Personal history of irradiation: Secondary | ICD-10-CM | POA: Diagnosis not present

## 2023-10-23 DIAGNOSIS — Z170 Estrogen receptor positive status [ER+]: Secondary | ICD-10-CM | POA: Diagnosis not present

## 2023-10-23 NOTE — Progress Notes (Signed)
 Radiation Oncology Follow up Note  Name: Annette Le   Date:   10/23/2023 MRN:  [id number] DOB: 1970-12-06    This 53 y.o. female presents to the clinic today for 3-year follow-up status post whole breast radiation to her right breast for stage Ia mucinous carcinoma ER/PR positive.  REFERRING PROVIDER: Antonette Angeline ORN, NP  HPI: Patient is a 53 year old female now out over 3 years having completed whole breast radiation to her right breast for ER/PR positive mucinous carcinoma.  Seen today in routine follow-up she is doing well.  She specifically denies breast tenderness cough or bone pain.  She is currently on tamoxifen  tolerating it well without side effect.  She had mammograms.  Last month which I have reviewed were BI-RADS 1 negative.  COMPLICATIONS OF TREATMENT: none  FOLLOW UP COMPLIANCE: keeps appointments   PHYSICAL EXAM:  BP 119/84   Pulse 91   Temp 98.3 F (36.8 C) (Tympanic)   Resp 16   Wt 156 lb (70.8 kg)   BMI 28.53 kg/m  Lungs are clear to A&P cardiac examination essentially unremarkable with regular rate and rhythm. No dominant mass or nodularity is noted in either breast in 2 positions examined. Incision is well-healed. No axillary or supraclavicular adenopathy is appreciated. Cosmetic result is excellent.  Well-developed well-nourished patient in NAD. HEENT reveals PERLA, EOMI, discs not visualized.  Oral cavity is clear. No oral mucosal lesions are identified. Neck is clear without evidence of cervical or supraclavicular adenopathy. Lungs are clear to A&P. Cardiac examination is essentially unremarkable with regular rate and rhythm without murmur rub or thrill. Abdomen is benign with no organomegaly or masses noted. Motor sensory and DTR levels are equal and symmetric in the upper and lower extremities. Cranial nerves II through XII are grossly intact. Proprioception is intact. No peripheral adenopathy or edema is identified. No motor or sensory levels are noted.  Crude visual fields are within normal range.  RADIOLOGY RESULTS: Mammograms reviewed compatible with above-stated findings  PLAN: Present time patient is now out over 3 years with no evidence of disease.  She continues to do well with very low side effect profile.  I will discontinue follow-up care she continues close follow-up care with medical oncology.  I would be happy to reevaluate her at any time should that be indicated.  Patient knows to call with any concerns.  I would like to take this opportunity to thank you for allowing me to participate in the care of your patient.SABRA Marcey Penton, MD

## 2023-11-15 NOTE — H&P (Signed)
 Annette Le is a 53 y.o. female here for Sono follow up - uterine enlargement  Pt returns today for work up for enlarged uterus  Pt has noted lower abdominal bulging for the last few months . Bleeding inpast she had to wear diapers bleeding was so heavy .  + aunt with ovarian cancer and mother with TAH / BSO age 84 ?? Reason    .u/s today : Indication ========   Enlarged uterus   Results ======   TO VIEW THE FORMAL REPORT IN [facility], OPEN THE PDF (click on Imaging result with paper clip icon, then click link under Scans on Order). The plain text version does not include embedded images or graphs. The image link is for reference only. ViewPoint is the official diagnostic image storage site.   Endovaginal Imaging ================   Indication: Endovaginal imaging was necessary to evaluate the uterus and ovaries. Probe: [id number].   Uterus ======   Visualized. Size 117 mm x 92 mm x 65 mm Enlarged, globular Position: anteverted Malformations: none Myometrium: heterogeneous with sonographic features of adenomyosis Endometrium: indistinct endometrial-myometrial interface. Endometrial thickness, total 7.6 mm Cervix details: nabothian cysts visualized No polyps identified Fibroid(s) Fibroma vs adenomyoma. Size 63.00 mm x 55.00 mm x 54 mm. Mean 57.3 mm. Vol 98.0 cm. FIGO - 4 (intramural no endometrial or serosal        involvement). Right lateral posterior wall   Right Ovary =========   Visualized, Normal. Outline: Normal. Morphology: Appropriate. Size 32 mm x 15 mm x 16 mm No cysts identified Follicles identified   Left Ovary ========   Visualized, slightly prominent. Outline: Normal. Morphology: Appropriate. Size 30 mm x 22 mm x 30 mm No cysts identified Follicle(s)    Size 22.0 mm x 20.0 mm x 20.0 mm. Mean 20.7 mm. Vol 4.608 cm   Cul de Sac =========   Normal. No Fluid Seen   History of Present Illness       Past Medical History:  has a past medical history  of Anxiety (06/24/2021), Breast mass, History of abnormal cervical Pap smear (11/2021), History of cancer (06/23/2020), and Rheumatoid arthritis (CMS/HHS-HCC).  Past Surgical History:  has a past surgical history that includes Mastectomy partial / lumpectomy (Right, 07/25/2021) and Colonoscopy (07/03/2022). Family History: family history includes Breast cancer in her maternal aunt; Colon cancer in her maternal grandfather; Diabetes in her maternal aunt, maternal uncle, and mother; High blood pressure (Hypertension) in her father; Liver cancer in her maternal aunt; Lung cancer in her maternal aunt; Pneumonia in her maternal uncle. Social History:  reports that she has never smoked. She has never used smokeless tobacco. She reports current alcohol use of about 1.0 standard drink of alcohol per week. She reports that she does not use drugs. OB/GYN History:  OB History       Gravida  1   Para  1   Term  1   Preterm      AB      Living  1        SAB      IAB      Ectopic      Molar      Multiple      Live Births  1             Allergies: is allergic to ibuprofen  and shrimp. Medications:  Current Medications    Current Outpatient Medications:    tamoxifen  (NOLVADEX ) 20 MG tablet, Take 20 mg by mouth, Disp: ,  Rfl:    ascorbic acid, vitamin C , (VITAMIN C ) 1000 MG tablet, Take 1,000 mg by mouth once daily (Patient not taking: Reported on 12/04/2021), Disp: , Rfl:    brompheniramine-pseudoephed-DM (BROMFED DM) 2-30-10 mg/5 mL syrup, Take 5 mLs by mouth every 6 (six) hours as needed (Patient not taking: Reported on 05/28/2022), Disp: 118 mL, Rfl: 0   fluticasone propionate (FLONASE) 50 mcg/actuation nasal spray, Place 1 spray into both nostrils 2 (two) times daily (Patient not taking: Reported on 12/04/2021), Disp: 16 g, Rfl: 0   predniSONE (DELTASONE) 10 MG tablet, 6 PO Q D X 1 DAY, THEN 5 PO Q D X 1 DAY, THEN 4 PO Q D X 1 DAY, THEN 3 PO Q D X 1 DAY, THEN 2 PO Q D X 1 DAY, THEN 1  PO Q D X 1 DAY (Patient not taking: Reported on 05/28/2022), Disp: 21 tablet, Rfl: 0   sodium, potassium, and magnesium (SUPREP) oral solution, Take 2 Bottles (1 kit total) by mouth as directed One kit contains 2 bottles.  Take both bottles at the times instructed by your provider. (Patient not taking: Reported on 06/19/2023), Disp: 354 mL, Rfl: 0   vitamin E 400 UNIT capsule, Take 400 Units by mouth once daily (Patient not taking: Reported on 06/19/2023), Disp: , Rfl:      Review of Systems: General:                      No fatigue or weight loss Eyes:                           No vision changes Ears:                            No hearing difficulty Respiratory:                No cough or shortness of breath Pulmonary:                  No asthma or shortness of breath Cardiovascular:           No chest pain, palpitations, dyspnea on exertion Gastrointestinal:          No abdominal bloating, chronic diarrhea, constipations, masses, pain or hematochezia Genitourinary:             No hematuria, dysuria, abnormal vaginal discharge, pelvic pain, Menometrorrhagia Lymphatic:                   No swollen lymph nodes Musculoskeletal:No muscle weakness Neurologic:                  No extremity weakness, syncope, seizure disorder Psychiatric:                  No history of depression, delusions or suicidal/homicidal ideation      Exam:       Vitals:    11/20/23 1113  BP: 105/73  Pulse: 93      Body mass index is 28.13 kg/m.   WDWN  female in NAD   Lungs: CTA  CV : RRR without murmur   Breast: exam done in sitting and lying position : No dimpling or retraction, no dominant mass, no spontaneous discharge, no axillary adenopathy Neck:  no thyromegaly Abdomen: soft , no mass, normal active bowel sounds,  non-tender, no rebound tenderness Pelvic: tanner  stage 5 ,  External genitalia: vulva /labia no lesions Urethra: no prolapse Vagina: normal physiologic d/c, not a candidate for TVH/ LAVH if  need be   Cervix: no lesions, no cervical motion tenderness   Uterus: 14+ weeks globular normal size shape and contour, non-tender Adnexa: no mass,  non-tender   Rectovaginal:  Pelvic exam done Chaperone present After discussion verbal consent obtained for embx Endometrial biopsy: The cervix was cleaned with betadine and a single tooth tenaculum is applied to the anterior cervix. Cervix attempted to be dilated . Reposition tenaculum . Unable to gain entrance into the endometrium - aborted procedure  Silver nitrate applied onto the tenaculum site .   Impression:    The primary encounter diagnosis was Abnormal uterine bleeding (AUB). Diagnoses of Intramural leiomyoma of uterus, Leukorrhea, and Enlarged uterus were also pertinent to this visit. Probable adenomyosis     Plan:    Assessment & Plan Fsh and Inhibin today . Ferritin . CbC. Follow for additional AUB . I have explained the natural progression into menopause . Given pelvic bulging and h/o heavy bleeding she elects to proceed wish  TAh / BSO    Pap smear done last visit -neg , neg HR HPV    Benefits and risks to surgery: The proposed benefit of the surgery has been discussed with the patient. The possible risks include, but are not limited to: organ injury to the bowel , bladder, ureters, and major blood vessels and nerves. There is a possibility of additional surgeries resulting from these injuries. There is also the risk of blood transfusion and the need to receive blood products during or after the procedure which may rarely lead to HIV or Hepatitis C infection. There is a risk of developing a deep venous thrombosis or a pulmonary embolism . There is the possibility of wound infection and also anesthetic complications, even the rare possibility of death. The patient understands these risks and wishes to proceed. All questions have been answered and the consent has been signed.     No follow-ups on file.   Annette CLARYCE DINSMORE, MD

## 2023-11-19 ENCOUNTER — Encounter: Payer: Self-pay | Admitting: Oncology

## 2023-11-19 ENCOUNTER — Inpatient Hospital Stay: Attending: Oncology

## 2023-11-19 ENCOUNTER — Inpatient Hospital Stay: Admitting: Oncology

## 2023-11-19 VITALS — BP 123/86 | HR 68 | Temp 98.4°F | Resp 18 | Wt 154.1 lb

## 2023-11-19 DIAGNOSIS — Z79810 Long term (current) use of selective estrogen receptor modulators (SERMs): Secondary | ICD-10-CM | POA: Diagnosis not present

## 2023-11-19 DIAGNOSIS — Z17411 Hormone receptor positive with human epidermal growth factor receptor 2 negative status: Secondary | ICD-10-CM | POA: Insufficient documentation

## 2023-11-19 DIAGNOSIS — C50911 Malignant neoplasm of unspecified site of right female breast: Secondary | ICD-10-CM | POA: Insufficient documentation

## 2023-11-19 DIAGNOSIS — R7401 Elevation of levels of liver transaminase levels: Secondary | ICD-10-CM

## 2023-11-19 DIAGNOSIS — C50919 Malignant neoplasm of unspecified site of unspecified female breast: Secondary | ICD-10-CM | POA: Diagnosis not present

## 2023-11-19 LAB — CBC WITH DIFFERENTIAL (CANCER CENTER ONLY)
Abs Immature Granulocytes: 0.02 K/uL (ref 0.00–0.07)
Basophils Absolute: 0.1 K/uL (ref 0.0–0.1)
Basophils Relative: 1 %
Eosinophils Absolute: 0.3 K/uL (ref 0.0–0.5)
Eosinophils Relative: 6 %
HCT: 41.6 % (ref 36.0–46.0)
Hemoglobin: 13.8 g/dL (ref 12.0–15.0)
Immature Granulocytes: 0 %
Lymphocytes Relative: 34 %
Lymphs Abs: 2 K/uL (ref 0.7–4.0)
MCH: 29 pg (ref 26.0–34.0)
MCHC: 33.2 g/dL (ref 30.0–36.0)
MCV: 87.4 fL (ref 80.0–100.0)
Monocytes Absolute: 0.5 K/uL (ref 0.1–1.0)
Monocytes Relative: 9 %
Neutro Abs: 2.9 K/uL (ref 1.7–7.7)
Neutrophils Relative %: 50 %
Platelet Count: 227 K/uL (ref 150–400)
RBC: 4.76 MIL/uL (ref 3.87–5.11)
RDW: 13.1 % (ref 11.5–15.5)
WBC Count: 5.7 K/uL (ref 4.0–10.5)
nRBC: 0 % (ref 0.0–0.2)

## 2023-11-19 LAB — CMP (CANCER CENTER ONLY)
ALT: 59 U/L — ABNORMAL HIGH (ref 0–44)
AST: 44 U/L — ABNORMAL HIGH (ref 15–41)
Albumin: 4.1 g/dL (ref 3.5–5.0)
Alkaline Phosphatase: 120 U/L (ref 38–126)
Anion gap: 7 (ref 5–15)
BUN: 13 mg/dL (ref 6–20)
CO2: 22 mmol/L (ref 22–32)
Calcium: 9.2 mg/dL (ref 8.9–10.3)
Chloride: 103 mmol/L (ref 98–111)
Creatinine: 0.83 mg/dL (ref 0.44–1.00)
GFR, Estimated: >60 mL/min (ref >60)
Glucose, Bld: 272 mg/dL — ABNORMAL HIGH (ref 70–99)
Potassium: 4 mmol/L (ref 3.5–5.1)
Sodium: 132 mmol/L — ABNORMAL LOW (ref 135–145)
Total Bilirubin: 1.1 mg/dL (ref 0.0–1.2)
Total Protein: 7.3 g/dL (ref 6.5–8.1)

## 2023-11-19 MED ORDER — TAMOXIFEN CITRATE 20 MG PO TABS: 20.0000 mg | ORAL_TABLET | Freq: Every day | ORAL | 1 refills | Status: AC

## 2023-11-19 NOTE — Assessment & Plan Note (Signed)
Continue calcium 1200mg  and vitamin D

## 2023-11-19 NOTE — Assessment & Plan Note (Signed)
 US  liver showed increased echotexture.  Possibly secondary to fatty liver disease.

## 2023-11-19 NOTE — Progress Notes (Signed)
 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 Case was discussed on tumor board on 08/01/2020, per Dr. Janel, this is a mucinous carcinoma, favorable histology.Status post adjuvant radiation Labs reviewed and discussed with Continue adjuvant tamoxifen  20 mg daily. annual pelvic examination by gynecologist -  Mammogram annually     Transaminitis US  liver showed increased echotexture.  Possibly secondary to fatty liver disease.  Hypocalcemia Continue calcium  1200mg  and vitamin D      Orders Placed This Encounter  Procedures   CMP (Cancer Center only)    Standing Status:   Future    Expected Date:   05/18/2024    Expiration Date:   08/16/2024   CBC with Differential (Cancer Center Only)    Standing Status:   Future    Expected Date:   05/18/2024    Expiration Date:   08/16/2024   Follow up in 6 months All questions were answered. The patient knows to call the clinic with any problems, questions or concerns.  Call Babara, MD, PhD [facility] Hematology Oncology 11/19/2023     HISTORY OF PRESENTING ILLNESS:   Annette Le is a  53 y.o.  female with PMH listed below was seen in consultation at the request of  Antonette Angeline ORN, NP  for evaluation of breast cancer.   She felt a painful right breast nodule for about 6 weeks. Went to see primary care physician and mammogram was obtained.  06/17/2020 bilateral diagnostic mammogram showed 14 x 15 x 12 mm right upper outer breast mass, 11 o'clock 2cm from nipple. close to the site of  palpable/painful concern. And 2 other probably benign mass at 12 o'clock.   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  Grade 2, DCIS not identified.  LVI not identified.  Patient is scheduled to have additional ultrasound-guided biopsy of the 2 masses at 12 o'clock position of the right breast. Patient presents to establish care discussed.  She has appointment scheduled to see surgery with Dr.Rodenberg.  She was accompanied by her husband.  Family history of breast cancer: Maternal aunt was diagnosed with breast cancer at the age of 7 Family history of other cancers: Maternal aunt stomach cancer, maternal grandfather pancreatic cancer, maternal aunt lung cancer-also a smoker.  Menarche: 53 years of age  premenopausal, LMP 06/22/2020 Number of pregnancies :  Age at first live childbirth: Used OCP: Remote use for a few months. Used estrogen and progesterone therapy: Denies History of Radiation to the chest: Denies Previous of breast biopsy: Denies  06/29/2020, right breast 12:00 retroareolar ultrasound-guided biopsy showed negative for cancer 07/08/2020, MRI breast bilateral with and without contrast showed known biopsy-proven malignancy over the upper outer quadrant of the right breast.  No additional suspicious masses or abdominal enhancement within either breast. 07/25/2020 underwent right breast lumpectomy and sentinel lymph node biopsy Pathology showed mucinous carcinoma, 1.5 cm, grade 2, 4 sentinel lymph nodes were excised and 0 was involved with malignancy.  Margins were all negative.  pT1c pN0, Per previous biopsy, ER 90%,  PR 90%, HER2 negative by immunohistochemistry Case was discussed on tumor board on 08/01/20 Consensus was reached to proceed with adjuvant radiation followed by antiestrogen treatments.  Family history of cancer, genetic testing is negative.   VUS in POLE and TERT  08/24/2020-09/14/2020 adjuvant  radiation 10/05/2020 started on Tamoxifen . Overall she tolerates well.   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  Per Dr.Nunez, she reports possibly being treated for latent TB in [place], and she took medication for 6 months. She was not able to remember the name of the medication. Dr.Nunez feels that she does not need to be treated for latent TB for twice, and treatment is optional.   INTERVAL HISTORY Annette Le is a 53 y.o. female who has above history reviewed by me today presents for follow up visit for management of right breast cancer She report feeling well,  She takes calcium  and Vitamin D  supplementations. No new breast concerns.  She follows with gynecology and there is upcoming plan for hysterectomy for abnormal uterine bleeding.    Review of Systems  Constitutional:  Negative for appetite change, chills, fatigue and fever.  HENT:   Negative for hearing loss and voice change.   Eyes:  Negative for eye problems.  Respiratory:  Negative for chest tightness and cough.   Cardiovascular:  Negative for chest pain.  Gastrointestinal:  Negative for abdominal distention, abdominal pain and blood in stool.  Endocrine: Positive for hot flashes.  Genitourinary:  Negative for difficulty urinating and frequency.   Musculoskeletal:  Negative for arthralgias and back pain.  Skin:  Negative for itching and rash.  Neurological:  Negative for extremity weakness.  Hematological:  Negative for adenopathy.  Psychiatric/Behavioral:  Negative for confusion.     MEDICAL HISTORY:  Past Medical History:  Diagnosis Date   Allergy    Asthma    Carpal tunnel syndrome    Family history of breast cancer    Family history of colon cancer    Family history of pancreatic cancer    Family history of stomach cancer    Knee injury    Personal history of radiation therapy 08/03/2020   Breast    SURGICAL HISTORY: Past Surgical History:  Procedure  Laterality Date   BREAST BIOPSY Right 06/22/2020   u/s bx @ 11:00-vision clip-INVASIVE MAMMARY CARCINOMA WITH MUCINOUS FEATURES.   BREAST BIOPSY Right 06/29/2020   us  bx at 12:00, x marker, benign   BREAST BIOPSY Left 07/24/2022   stereo bx, calcs, RIBBON clip-SCLEROSING ADENOSIS AND COLUMNAR CELL CHANGE WITH ASSOCIATED CALCIFICATIONS of the LEFT breast, medial, (ribbon clip).   BREAST BIOPSY Left 07/24/2022   MM LT BREAST BX W LOC DEV 1ST LESION IMAGE BX SPEC STEREO GUIDE 07/24/2022 [facility]   BREAST LUMPECTOMY Right 07/15/2020   Mucinous carcinoma   NEG margins   BREAST LUMPECTOMY,RADIO FREQ LOCALIZER,AXILLARY SENTINEL LYMPH NODE BIOPSY Right 07/25/2020   Procedure: BREAST LUMPECTOMY,RADIO FREQ LOCALIZER,AXILLARY SENTINEL LYMPH NODE BIOPSY;  Surgeon: Lane Shope, MD;  Location: [facility];  Service: General;  Laterality: Right;    SOCIAL HISTORY: Social History   Socioeconomic History   Marital status: Married    Spouse name: Not on file   Number of children: Not on file   Years of education: Not on file   Highest education level: Some college, no degree  Occupational History   Not on file  Tobacco Use   Smoking status: Former    Current packs/day: 0.00  Types: Cigarettes    Quit date: 10/11/2003    Years since quitting: 20.1   Smokeless tobacco: Never  Vaping Use   Vaping status: Never Used  Substance and Sexual Activity   Alcohol use: Yes    Comment: occ   Drug use: Never   Sexual activity: Not on file  Other Topics Concern   Not on file  Social History Narrative   Not on file   Social Drivers of Health   Financial Resource Strain: Low Risk  (06/12/2023)   Received from [facility]   Overall Financial Resource Strain (CARDIA)    Difficulty of Paying Living Expenses: Not hard at all  Food Insecurity: No Food Insecurity (06/12/2023)   Received from [facility]   Hunger Vital Sign    Within the past 12 months, you  worried that your food would run out before you got the money to buy more.: Never true    Within the past 12 months, the food you bought just didn't last and you didn't have money to get more.: Never true  Transportation Needs: No Transportation Needs (06/12/2023)   Received from [facility] - Transportation    In the past 12 months, has lack of transportation kept you from medical appointments or from getting medications?: No    Lack of Transportation (Non-Medical): No  Physical Activity: Sufficiently Active (08/09/2022)   Exercise Vital Sign    Days of Exercise per Week: 7 days    Minutes of Exercise per Session: 150+ min  Stress: No Stress Concern Present (08/09/2022)   [employer] of Occupational Health - Occupational Stress Questionnaire    Feeling of Stress : Only a little  Social Connections: Moderately Integrated (08/09/2022)   Social Connection and Isolation Panel    Frequency of Communication with Friends and Family: Once a week    Frequency of Social Gatherings with Friends and Family: Once a week    Attends Religious Services: More than 4 times per year    Active Member of [employer] or Organizations: Yes    Attends Engineer, structural: More than 4 times per year    Marital Status: Married  Catering manager Violence: Not on file    FAMILY HISTORY: Family History  Problem Relation Age of Onset   Cancer Father 4       intestinal vs. stomach   Breast cancer Maternal Aunt 79   Colon cancer Maternal Aunt    Lung cancer Maternal Aunt    Stomach cancer Maternal Aunt    Pancreatic cancer Maternal Grandfather        d. 33s   Breast cancer Cousin     ALLERGIES:  is allergic to shrimp extract, ibuprofen , shrimp [shellfish allergy], tylenol [acetaminophen], and other.  MEDICATIONS:  Current Outpatient Medications  Medication Sig Dispense Refill   Calcium  Carb-Cholecalciferol (CALTRATE 600+D3) 600-800 MG-UNIT TABS Take by mouth.      Iron -Vitamin C  65-125 MG TABS Take 1 tablet by mouth daily at 2 PM. 30 tablet 3   VITAMIN E PO Take by mouth.     tamoxifen  (NOLVADEX ) 20 MG tablet Take 1 tablet (20 mg total) by mouth daily. 90 tablet 1   No current facility-administered medications for this visit.     PHYSICAL EXAMINATION: ECOG PERFORMANCE STATUS: 0 - Asymptomatic Vitals:   11/19/23 1331  BP: 123/86  Pulse: 68  Resp: 18  Temp: 98.4 F (36.9 C)   Filed Weights  11/19/23 1331  Weight: 154 lb 1.6 oz (69.9 kg)    Physical Exam Constitutional:      General: She is not in acute distress. HENT:     Head: Normocephalic and atraumatic.  Eyes:     General: No scleral icterus. Cardiovascular:     Rate and Rhythm: Normal rate and regular rhythm.     Heart sounds: Normal heart sounds.  Pulmonary:     Effort: Pulmonary effort is normal. No respiratory distress.     Breath sounds: No wheezing.  Abdominal:     General: There is no distension.  Musculoskeletal:        General: No deformity. Normal range of motion.     Cervical back: Normal range of motion and neck supple.  Skin:    General: Skin is warm and dry.     Findings: No erythema or rash.  Neurological:     Mental Status: She is alert and oriented to person, place, and time. Mental status is at baseline.  Psychiatric:        Mood and Affect: Mood normal.     LABORATORY DATA:  I have reviewed the data as listed    Latest Ref Rng & Units 11/19/2023    1:12 PM 05/20/2023    1:28 PM 11/19/2022    1:17 PM  CBC  WBC 4.0 - 10.5 K/uL 5.7  6.2  6.4   Hemoglobin 12.0 - 15.0 g/dL 86.1  86.3  88.0   Hematocrit 36.0 - 46.0 % 41.6  41.5  37.0   Platelets 150 - 400 K/uL 227  201  217       Latest Ref Rng & Units 11/19/2023    1:12 PM 05/20/2023    1:28 PM 11/19/2022    1:17 PM  CMP  Glucose 70 - 99 mg/dL 727  862  863   BUN 6 - 20 mg/dL 13  13  13    Creatinine 0.44 - 1.00 mg/dL 9.16  9.30  9.27   Sodium 135 - 145 mmol/L 132  140  137   Potassium 3.5 -  5.1 mmol/L 4.0  4.0  4.0   Chloride 98 - 111 mmol/L 103  107  108   CO2 22 - 32 mmol/L 22  25  22    Calcium  8.9 - 10.3 mg/dL 9.2  9.2  8.6   Total Protein 6.5 - 8.1 g/dL 7.3  7.3  6.7   Total Bilirubin 0.0 - 1.2 mg/dL 1.1  0.7  0.5   Alkaline Phos 38 - 126 U/L 120  87  61   AST 15 - 41 U/L 44  53  28   ALT 0 - 44 U/L 59  79  33      RADIOGRAPHIC STUDIES: I have personally reviewed the radiological images as listed and agreed with the findings in the report. No results found.

## 2023-11-19 NOTE — Assessment & Plan Note (Addendum)
 Right breast Stage 1A mucinous carcinoma. Case was discussed on tumor board on 08/01/2020, per Dr. Janel, this is a mucinous carcinoma, favorable histology.Status post adjuvant radiation Labs reviewed and discussed with Continue adjuvant tamoxifen  20 mg daily. annual pelvic examination by gynecologist -  Mammogram annually

## 2023-11-20 ENCOUNTER — Ambulatory Visit: Payer: Self-pay | Admitting: Oncology

## 2023-11-20 LAB — ESTRADIOL: Estradiol: 169 pg/mL

## 2023-11-20 LAB — FOLLICLE STIMULATING HORMONE: FSH: 8.6 m[IU]/mL

## 2023-11-28 ENCOUNTER — Other Ambulatory Visit: Payer: Self-pay

## 2023-11-28 ENCOUNTER — Encounter
Admission: RE | Admit: 2023-11-28 | Discharge: 2023-11-28 | Disposition: A | Discharge status: Home / Self Care | Source: Ambulatory Visit | Attending: Obstetrics and Gynecology | Admitting: Obstetrics and Gynecology

## 2023-11-28 HISTORY — DX: Anemia, unspecified: D64.9

## 2023-11-28 HISTORY — DX: Malignant (primary) neoplasm, unspecified: C80.1

## 2023-11-28 NOTE — Patient Instructions (Addendum)
 Your procedure is scheduled on: Thursday 12/05/23 Report to the Registration Desk on the 1st floor of the [facility]. To find out your arrival time, please call [phone] between 1PM - 3PM on: Wednesday 12/04/23 If your arrival time is 6:00 am, do not arrive before that time as the [facility] entrance doors do not open until 6:00 am.  REMEMBER: Instructions that are not followed completely may result in serious medical risk, up to and including death; or upon the discretion of your surgeon and anesthesiologist your surgery may need to be rescheduled.  Do not eat food after midnight the night before surgery.  No gum chewing or hard candies.  You may however, drink CLEAR liquids up to 2 hours before you are scheduled to arrive for your surgery. Do not drink anything within 2 hours of your scheduled arrival time.  Clear liquids include: - water  - apple juice without pulp - gatorade (not RED colors) - black coffee or tea (Do NOT add milk or creamers to the coffee or tea) Do NOT drink anything that is not on this list.  In addition, your doctor has ordered for you to drink the provided:  Ensure Pre-Surgery Clear Carbohydrate Drink  Drinking this carbohydrate drink up to two hours before surgery helps to reduce insulin resistance and improve patient outcomes. Please complete drinking 2 hours before scheduled arrival time.  One week prior to surgery: Stop Anti-inflammatories (NSAIDS) such as Advil , Aleve , Ibuprofen , Motrin , Naproxen , Naprosyn  and Aspirin based products such as Excedrin, Goody's Powder, BC Powder.  You may however, continue to take Tylenol if needed for pain up until the day of surgery.  Stop ANY OVER THE COUNTER supplements and vitamins until after surgery.    Continue taking all of your other prescription medications up until the day of surgery. (Hold your Tamoxifen  as previously instructed)  ON THE DAY OF SURGERY ONLY TAKE THESE MEDICATIONS WITH SIPS OF  WATER:  none  No Alcohol for 24 hours before or after surgery.  No Smoking including e-cigarettes for 24 hours before surgery.  No chewable tobacco products for at least 6 hours before surgery.  No nicotine patches on the day of surgery.  Do not use any recreational drugs for at least a week (preferably 2 weeks) before your surgery.  Please be advised that the combination of cocaine and anesthesia may have negative outcomes, up to and including death. If you test positive for cocaine, your surgery will be cancelled.  On the morning of surgery brush your teeth with toothpaste and water, you may rinse your mouth with mouthwash if you wish. Do not swallow any toothpaste or mouthwash.  Use CHG Soap or wipes as directed on instruction sheet.  Do not shave body hair from the neck down 48 hours before surgery.  Do not wear lotions, powders, or perfumes.  Do not wear jewelry, make-up, hairpins, clips or nail polish.  For welded (permanent) jewelry: bracelets, anklets, waist bands, etc.  Please have this removed prior to surgery.  If it is not removed, there is a chance that hospital personnel will need to cut it off on the day of surgery.  Contact lenses, hearing aids and dentures may not be worn into surgery.  Do not bring valuables to the hospital. [facility] is not responsible for any missing/lost belongings or valuables.   Notify your doctor Administrator) if there is any change in your medical condition (cold, fever, infection).  Wear comfortable clothing (specific to your surgery  type) to the hospital.  After surgery, you can help prevent lung complications by doing breathing exercises.  Take deep breaths and cough every 1-2 hours. Your doctor may order a device called an Incentive Spirometer to help you take deep breaths. When coughing or sneezing, hold a pillow firmly against your incision with both hands. This is called "splinting." Doing this helps protect your incision. It  also decreases belly discomfort.  If you are being admitted to the hospital overnight, leave your suitcase in the car. After surgery it may be brought to your room.  In case of increased patient census, it may be necessary for you, the patient, to continue your postoperative care in the Same Day Surgery department.  Please call the Pre-admissions Testing Dept. at [phone] if you have any questions about these instructions.  Surgery Visitation Policy:  Patients having surgery or a procedure may have two visitors.  Children under the age of 61 must have an adult with them who is not the patient.  Inpatient Visitation:    Visiting hours are 7 a.m. to 8 p.m. Up to four visitors are allowed at one time in a patient room. The visitors may rotate out with other people during the day.  One visitor age 80 or older may stay with the patient overnight and must be in the room by 8 p.m.   Merchandiser, retail to address health-related social needs:  [email or web address]                                                                                                             Preparing for Surgery with CHLORHEXIDINE  GLUCONATE (CHG) Soap  Chlorhexidine  Gluconate (CHG) Soap  o An antiseptic cleaner that kills germs and bonds with the skin to continue killing germs even after washing  o Used for showering the night before surgery and morning of surgery  Before surgery, you can play an important role by reducing the number of germs on your skin.  CHG (Chlorhexidine  gluconate) soap is an antiseptic cleanser which kills germs and bonds with the skin to continue killing germs even after washing.  Please do not use if you have an allergy to CHG or antibacterial soaps. If your skin becomes reddened/irritated stop using the CHG.  1. Shower the NIGHT BEFORE SURGERY with CHG soap.  2. If you choose to wash your hair, wash your hair first as usual with your normal  shampoo.  3. After shampooing, rinse your hair and body thoroughly to remove the shampoo.  4. Use CHG as you would any other liquid soap. You can apply CHG directly to the skin and wash gently with a clean washcloth.  5. Apply the CHG soap to your body only from the neck down. Do not use on open wounds or open sores. Avoid contact with your eyes, ears, mouth, and genitals (private parts). Wash face and genitals (private parts) with your normal soap.  6. Wash thoroughly, paying special attention to the area where your surgery will be performed.  7. Thoroughly rinse  your body with warm water.  8. Do not shower/wash with your normal soap after using and rinsing off the CHG soap.  9. Do not use lotions, oils, etc., after showering with CHG.  10. Pat yourself dry with a clean towel.  11. Wear clean pajamas to bed the night before surgery.  12. Place clean sheets on your bed the night of your shower and do not sleep with pets.  13. Do not apply any deodorants/lotions/powders.  14. Please wear clean clothes to the hospital.  15. Remember to brush your teeth with your regular toothpaste.  How to Use an Incentive Spirometer  An incentive spirometer is a tool that measures how well you are filling your lungs with each breath. Learning to take long, deep breaths using this tool can help you keep your lungs clear and active. This may help to reverse or lessen your chance of developing breathing (pulmonary) problems, especially infection. You may be asked to use a spirometer: After a surgery. If you have a lung problem or a history of smoking. After a long period of time when you have been unable to move or be active. If the spirometer includes an indicator to show the highest number that you have reached, your health care provider or respiratory therapist will help you set a goal. Keep a log of your progress as told by your health care provider. What are the risks? Breathing too quickly may  cause dizziness or cause you to pass out. Take your time so you do not get dizzy or light-headed. If you are in pain, you may need to take pain medicine before doing incentive spirometry. It is harder to take a deep breath if you are having pain. How to use your incentive spirometer  Sit up on the edge of your bed or on a chair. Hold the incentive spirometer so that it is in an upright position. Before you use the spirometer, breathe out normally. Place the mouthpiece in your mouth. Make sure your lips are closed tightly around it. Breathe in slowly and as deeply as you can through your mouth, causing the piston or the ball to rise toward the top of the chamber. Hold your breath for 3-5 seconds, or for as long as possible. If the spirometer includes a coach indicator, use this to guide you in breathing. Slow down your breathing if the indicator goes above the marked areas. Remove the mouthpiece from your mouth and breathe out normally. The piston or ball will return to the bottom of the chamber. Rest for a few seconds, then repeat the steps 10 or more times. Take your time and take a few normal breaths between deep breaths so that you do not get dizzy or light-headed. Do this every 1-2 hours when you are awake. If the spirometer includes a goal marker to show the highest number you have reached (best effort), use this as a goal to work toward during each repetition. After each set of 10 deep breaths, cough a few times. This will help to make sure that your lungs are clear. If you have an incision on your chest or abdomen from surgery, place a pillow or a rolled-up towel firmly against the incision when you cough. This can help to reduce pain while taking deep breaths and coughing. General tips When you are able to get out of bed: Walk around often. Continue to take deep breaths and cough in order to clear your lungs. Keep using the incentive spirometer until  your health care provider says it is  okay to stop using it. If you have been in the hospital, you may be told to keep using the spirometer at home. Contact a health care provider if: You are having difficulty using the spirometer. You have trouble using the spirometer as often as instructed. Your pain medicine is not giving enough relief for you to use the spirometer as told. You have a fever. Get help right away if: You develop shortness of breath. You develop a cough with bloody mucus from the lungs. You have fluid or blood coming from an incision site after you cough. Summary An incentive spirometer is a tool that can help you learn to take long, deep breaths to keep your lungs clear and active. You may be asked to use a spirometer after a surgery, if you have a lung problem or a history of smoking, or if you have been inactive for a long period of time. Use your incentive spirometer as instructed every 1-2 hours while you are awake. If you have an incision on your chest or abdomen, place a pillow or a rolled-up towel firmly against your incision when you cough. This will help to reduce pain. Get help right away if you have shortness of breath, you cough up bloody mucus, or blood comes from your incision when you cough. This information is not intended to replace advice given to you by your health care provider. Make sure you discuss any questions you have with your health care provider. Document Revised: 04/27/2019 Document Reviewed: 04/27/2019 Elsevier Patient Education  2023 [employer].

## 2023-11-28 NOTE — Pre-Procedure Instructions (Signed)
 Called pt to do pre op interview. No answer. Left VM.

## 2023-11-29 ENCOUNTER — Ambulatory Visit: Payer: Self-pay | Admitting: Urgent Care

## 2023-11-29 ENCOUNTER — Encounter
Admission: RE | Admit: 2023-11-29 | Discharge: 2023-11-29 | Disposition: A | Discharge status: Home / Self Care | Source: Ambulatory Visit | Attending: Obstetrics and Gynecology | Admitting: Obstetrics and Gynecology

## 2023-11-29 ENCOUNTER — Encounter: Payer: Self-pay | Admitting: Urgent Care

## 2023-11-29 DIAGNOSIS — R7303 Prediabetes: Secondary | ICD-10-CM | POA: Diagnosis not present

## 2023-11-29 DIAGNOSIS — Z01812 Encounter for preprocedural laboratory examination: Secondary | ICD-10-CM | POA: Insufficient documentation

## 2023-11-29 DIAGNOSIS — N939 Abnormal uterine and vaginal bleeding, unspecified: Secondary | ICD-10-CM

## 2023-11-29 DIAGNOSIS — E119 Type 2 diabetes mellitus without complications: Secondary | ICD-10-CM | POA: Insufficient documentation

## 2023-11-29 DIAGNOSIS — Z01818 Encounter for other preprocedural examination: Secondary | ICD-10-CM | POA: Diagnosis not present

## 2023-11-29 DIAGNOSIS — E663 Overweight: Secondary | ICD-10-CM | POA: Insufficient documentation

## 2023-11-29 DIAGNOSIS — D649 Anemia, unspecified: Secondary | ICD-10-CM | POA: Insufficient documentation

## 2023-11-29 HISTORY — DX: Malignant neoplasm of unspecified site of unspecified female breast: C50.919

## 2023-11-29 HISTORY — DX: Type 2 diabetes mellitus without complications: E11.9

## 2023-11-29 LAB — BASIC METABOLIC PANEL WITH GFR
Anion gap: 14 (ref 5–15)
BUN: 6 mg/dL (ref 6–20)
CO2: 21 mmol/L — ABNORMAL LOW (ref 22–32)
Calcium: 8.4 mg/dL — ABNORMAL LOW (ref 8.9–10.3)
Chloride: 109 mmol/L (ref 98–111)
Creatinine, Ser: 0.62 mg/dL (ref 0.44–1.00)
GFR, Estimated: >60 mL/min (ref >60)
Glucose, Bld: 185 mg/dL — ABNORMAL HIGH (ref 70–99)
Potassium: 3.8 mmol/L (ref 3.5–5.1)
Sodium: 144 mmol/L (ref 135–145)

## 2023-11-29 LAB — HEMOGLOBIN A1C
Hgb A1c MFr Bld: 8.4 % — ABNORMAL HIGH (ref 4.8–5.6)
Mean Plasma Glucose: 194.38 mg/dL

## 2023-11-29 LAB — CBC
HCT: 32.2 % — ABNORMAL LOW (ref 36.0–46.0)
Hemoglobin: 10.5 g/dL — ABNORMAL LOW (ref 12.0–15.0)
MCH: 29.1 pg (ref 26.0–34.0)
MCHC: 32.6 g/dL (ref 30.0–36.0)
MCV: 89.2 fL (ref 80.0–100.0)
Platelets: 207 K/uL (ref 150–400)
RBC: 3.61 MIL/uL — ABNORMAL LOW (ref 3.87–5.11)
RDW: 13 % (ref 11.5–15.5)
WBC: 4.4 K/uL (ref 4.0–10.5)
nRBC: 0 % (ref 0.0–0.2)

## 2023-11-29 LAB — TYPE AND SCREEN
ABO/RH(D): A POS
Antibody Screen: NEGATIVE

## 2023-11-29 NOTE — Progress Notes (Addendum)
  Perioperative Services: Pre-Admission/Anesthesia Testing  Abnormal Lab Notification    Date: 11/29/23  Name: Annette Le MRN:   [id number]  Re: Abnormal labs noted during PAT appointment   Provider(s) Notified: Schermerhorn, Debby, MD          Antonette Angeline ORN, NP  Notification mode: Routed and/or faxed via [facility]  Planned procedure: HYSTERECTOMY, ABDOMINAL, WITH SALPINGO-OOPHORECTOMY    ABNORMAL LAB VALUE(S): Lab Results  Component Value Date   GLUCOSE 185 (H) 11/29/2023    Lab Results  Component Value Date   HGBA1C 8.4 (H) 11/29/2023   NOTES:  Patient with a (+) familial history of T2DM diagnosis. Preoperative glucose level elevated. In review of her EMR, I appreciated the fact that patient's glucose levels have been elevated over the course of the past several years. Her last HgbA1c placed her in the prediabetic range when checked back in 2022, at which time A1c was 6.2%. In the setting of elevated blood glucose levels and past elevated A1c levels, I elected to reassess patient's diabetes screening today prior to her upcoming surgery with OB/GYN. HgbA1c found to be elevated at  8.4%, which effectively confirmed my suspicion that had developed T2DM (newly diagnosed). Patient is anemic as well; Hgb 10.5 g/dL. That said, her RBC indices are normal, making IDA less likely to be contributory to a falsely elevated A1c.   In efforts to reduce the risk of developing SSI, or other potential perioperative complications, this communication is being sent in order to determine if patient is deemed to be adequately optimized  for surgery. In light of the aforementioned A1c, her diabetes could pose increased risks for the patient during their perioperative course and subsequent recovery period. With that being said, the benefit of improving glycemic control must be weighed against the overall risks associated with delaying a necessary elective surgical procedure for this patient.    Patient's medical history has been updated to reflect her newly diagnosed T2DM. I will also communicate results to her PCP of record so that patient can be brought into the office for education and treatment as deemed appropriate. With that said, I have concerns about her following up. It does not appear as if she has been followed by a PCP since 2022; lost to follow up. It appears as if patient utilizes MyChart. I will send her a message to make her aware of the new diabetes diagnosis and instruct on my recommendations that she follow up with medicine for management.   No further needs from the PAT department identified at this time.   Dorise Pereyra, MSN, APRN, FNP-C, CEN [facility]  Perioperative Services Nurse Practitioner Phone: [phone] Fax: [phone] 11/29/23 9:37 PM

## 2023-11-30 ENCOUNTER — Encounter: Payer: Self-pay | Admitting: Urgent Care

## 2023-12-05 ENCOUNTER — Inpatient Hospital Stay
Admission: RE | Admit: 2023-12-05 | Discharge: 2023-12-06 | DRG: 743 | Disposition: A | Discharge status: Home / Self Care | Attending: Obstetrics and Gynecology | Admitting: Obstetrics and Gynecology

## 2023-12-05 ENCOUNTER — Inpatient Hospital Stay: Payer: Self-pay | Admitting: Urgent Care

## 2023-12-05 ENCOUNTER — Encounter: Payer: Self-pay | Admitting: Obstetrics and Gynecology

## 2023-12-05 ENCOUNTER — Encounter
Admission: RE | Disposition: A | Discharge status: Home / Self Care | Payer: Self-pay | Source: Home / Self Care | Attending: Obstetrics and Gynecology

## 2023-12-05 ENCOUNTER — Inpatient Hospital Stay: Admitting: Certified Registered"

## 2023-12-05 ENCOUNTER — Other Ambulatory Visit: Payer: Self-pay

## 2023-12-05 DIAGNOSIS — N898 Other specified noninflammatory disorders of vagina: Secondary | ICD-10-CM | POA: Diagnosis present

## 2023-12-05 DIAGNOSIS — Z01818 Encounter for other preprocedural examination: Principal | ICD-10-CM

## 2023-12-05 DIAGNOSIS — J45909 Unspecified asthma, uncomplicated: Secondary | ICD-10-CM | POA: Diagnosis not present

## 2023-12-05 DIAGNOSIS — Z800 Family history of malignant neoplasm of digestive organs: Secondary | ICD-10-CM | POA: Diagnosis not present

## 2023-12-05 DIAGNOSIS — Z801 Family history of malignant neoplasm of trachea, bronchus and lung: Secondary | ICD-10-CM

## 2023-12-05 DIAGNOSIS — D251 Intramural leiomyoma of uterus: Principal | ICD-10-CM | POA: Diagnosis present

## 2023-12-05 DIAGNOSIS — Z853 Personal history of malignant neoplasm of breast: Secondary | ICD-10-CM | POA: Diagnosis not present

## 2023-12-05 DIAGNOSIS — Z9889 Other specified postprocedural states: Secondary | ICD-10-CM

## 2023-12-05 DIAGNOSIS — N939 Abnormal uterine and vaginal bleeding, unspecified: Secondary | ICD-10-CM | POA: Diagnosis not present

## 2023-12-05 DIAGNOSIS — E119 Type 2 diabetes mellitus without complications: Secondary | ICD-10-CM | POA: Diagnosis not present

## 2023-12-05 DIAGNOSIS — M069 Rheumatoid arthritis, unspecified: Secondary | ICD-10-CM | POA: Diagnosis present

## 2023-12-05 DIAGNOSIS — Z803 Family history of malignant neoplasm of breast: Secondary | ICD-10-CM | POA: Diagnosis not present

## 2023-12-05 DIAGNOSIS — Z170 Estrogen receptor positive status [ER+]: Secondary | ICD-10-CM | POA: Diagnosis not present

## 2023-12-05 HISTORY — PX: HYSTERECTOMY ABDOMINAL WITH SALPINGO-OOPHORECTOMY: SHX6792

## 2023-12-05 LAB — POCT PREGNANCY, URINE: Preg Test, Ur: NEGATIVE

## 2023-12-05 LAB — ABO/RH: ABO/RH(D): A POS

## 2023-12-05 SURGERY — HYSTERECTOMY, ABDOMINAL, WITH SALPINGO-OOPHORECTOMY
Anesthesia: General | Site: Uterus | Laterality: Bilateral

## 2023-12-05 MED ORDER — DEXAMETHASONE SOD PHOSPHATE PF 10 MG/ML IJ SOLN
INTRAMUSCULAR | Status: DC | PRN
  Administered 2023-12-05: 10 mg via INTRAVENOUS

## 2023-12-05 MED ORDER — ONDANSETRON HCL 4 MG PO TABS: 4.0000 mg | ORAL_TABLET | Freq: Four times a day (QID) | ORAL | Status: DC | PRN

## 2023-12-05 MED ORDER — ONDANSETRON HCL 4 MG/2ML IJ SOLN: 4.0000 mg | Freq: Four times a day (QID) | INTRAMUSCULAR | Status: DC | PRN

## 2023-12-05 MED ORDER — MIDAZOLAM HCL (PF) 2 MG/2ML IJ SOLN
INTRAMUSCULAR | Status: DC | PRN
  Administered 2023-12-05: 2 mg via INTRAVENOUS

## 2023-12-05 MED ORDER — ORAL CARE MOUTH RINSE: 15.0000 mL | Freq: Once | OROMUCOSAL | Status: AC

## 2023-12-05 MED ORDER — ROCURONIUM BROMIDE 100 MG/10ML IV SOLN
INTRAVENOUS | Status: DC | PRN
  Administered 2023-12-05: 20 mg via INTRAVENOUS
  Administered 2023-12-05 (×2): 30 mg via INTRAVENOUS
  Administered 2023-12-05: 50 mg via INTRAVENOUS

## 2023-12-05 MED ORDER — METRONIDAZOLE 500 MG/100ML IV SOLN
500.0000 mg | Freq: Two times a day (BID) | INTRAVENOUS | Status: DC
  Administered 2023-12-05 (×2): 500 mg via INTRAVENOUS
  Filled 2023-12-05 (×3): qty 100

## 2023-12-05 MED ORDER — PROPOFOL 1000 MG/100ML IV EMUL
INTRAVENOUS | Status: AC
  Filled 2023-12-05: qty 100

## 2023-12-05 MED ORDER — DIPHENHYDRAMINE HCL 12.5 MG/5ML PO ELIX
12.5000 mg | ORAL_SOLUTION | Freq: Four times a day (QID) | ORAL | Status: DC | PRN
Start: 2023-12-05 — End: 2023-12-05

## 2023-12-05 MED ORDER — STERILE WATER FOR IRRIGATION IR SOLN
Status: DC | PRN
  Administered 2023-12-05: 2000 mL

## 2023-12-05 MED ORDER — SUGAMMADEX SODIUM 200 MG/2ML IV SOLN
INTRAVENOUS | Status: DC | PRN
  Administered 2023-12-05: 142.4 mg via INTRAVENOUS

## 2023-12-05 MED ORDER — PROPOFOL 500 MG/50ML IV EMUL
INTRAVENOUS | Status: DC | PRN
  Administered 2023-12-05: 145 ug/kg/min via INTRAVENOUS

## 2023-12-05 MED ORDER — POVIDONE-IODINE 10 % EX SWAB: 2.0000 | Freq: Once | CUTANEOUS | Status: DC

## 2023-12-05 MED ORDER — NALOXONE HCL 0.4 MG/ML IJ SOLN
0.4000 mg | INTRAMUSCULAR | Status: DC | PRN
Start: 2023-12-05 — End: 2023-12-05

## 2023-12-05 MED ORDER — SODIUM CHLORIDE (PF) 0.9 % IJ SOLN
INTRAMUSCULAR | Status: AC
Start: 2023-12-05 — End: 2023-12-05
  Filled 2023-12-05: qty 20

## 2023-12-05 MED ORDER — DEXMEDETOMIDINE HCL IN NACL 200 MCG/50ML IV SOLN
INTRAVENOUS | Status: DC | PRN
  Administered 2023-12-05: 4 ug via INTRAVENOUS
  Administered 2023-12-05: 8 ug via INTRAVENOUS

## 2023-12-05 MED ORDER — KETAMINE HCL 10 MG/ML IJ SOLN
INTRAMUSCULAR | Status: DC | PRN
Start: 2023-12-05 — End: 2023-12-05
  Administered 2023-12-05: 10 mg via INTRAVENOUS
  Administered 2023-12-05: 20 mg via INTRAVENOUS

## 2023-12-05 MED ORDER — BUPIVACAINE HCL 0.5 % IJ SOLN
INTRAMUSCULAR | Status: DC | PRN
  Administered 2023-12-05: 50 mL

## 2023-12-05 MED ORDER — CHLORHEXIDINE GLUCONATE 0.12 % MT SOLN
OROMUCOSAL | Status: AC
  Filled 2023-12-05: qty 15

## 2023-12-05 MED ORDER — MIDAZOLAM HCL 2 MG/2ML IJ SOLN
INTRAMUSCULAR | Status: AC
  Filled 2023-12-05: qty 2

## 2023-12-05 MED ORDER — LACTATED RINGERS IV SOLN: INTRAVENOUS | Status: DC

## 2023-12-05 MED ORDER — GABAPENTIN 300 MG PO CAPS
300.0000 mg | ORAL_CAPSULE | Freq: Every day | ORAL | Status: DC
  Administered 2023-12-05: 300 mg via ORAL
  Filled 2023-12-05: qty 1

## 2023-12-05 MED ORDER — ONDANSETRON HCL 4 MG/2ML IJ SOLN
INTRAMUSCULAR | Status: DC | PRN
  Administered 2023-12-05 (×2): 4 mg via INTRAVENOUS

## 2023-12-05 MED ORDER — BUPIVACAINE HCL (PF) 0.5 % IJ SOLN
INTRAMUSCULAR | Status: AC
  Filled 2023-12-05: qty 60

## 2023-12-05 MED ORDER — CHLORHEXIDINE GLUCONATE 0.12 % MT SOLN
15.0000 mL | Freq: Once | OROMUCOSAL | Status: AC
  Administered 2023-12-05: 15 mL via OROMUCOSAL

## 2023-12-05 MED ORDER — DOCUSATE SODIUM 100 MG PO CAPS
100.0000 mg | ORAL_CAPSULE | Freq: Two times a day (BID) | ORAL | Status: DC
  Administered 2023-12-05: 100 mg via ORAL
  Filled 2023-12-05: qty 1

## 2023-12-05 MED ORDER — HYDROMORPHONE HCL 1 MG/ML IJ SOLN
INTRAMUSCULAR | Status: DC | PRN
  Administered 2023-12-05: 1 mg via INTRAVENOUS

## 2023-12-05 MED ORDER — BUPIVACAINE LIPOSOME 1.3 % IJ SUSP
INTRAMUSCULAR | Status: AC
Start: 2023-12-05 — End: 2023-12-05
  Filled 2023-12-05: qty 20

## 2023-12-05 MED ORDER — GLYCOPYRROLATE 0.2 MG/ML IJ SOLN
INTRAMUSCULAR | Status: DC | PRN
  Administered 2023-12-05: .2 mg via INTRAVENOUS

## 2023-12-05 MED ORDER — LIDOCAINE HCL (CARDIAC) PF 100 MG/5ML IV SOSY
PREFILLED_SYRINGE | INTRAVENOUS | Status: DC | PRN
  Administered 2023-12-05: 80 mg via INTRAVENOUS

## 2023-12-05 MED ORDER — CEFAZOLIN SODIUM-DEXTROSE 2-4 GM/100ML-% IV SOLN
INTRAVENOUS | Status: AC
Start: 2023-12-05 — End: 2023-12-05
  Filled 2023-12-05: qty 100

## 2023-12-05 MED ORDER — MORPHINE SULFATE 1 MG/ML IV SOLN PCA
INTRAVENOUS | Status: DC
  Filled 2023-12-05 (×2): qty 30

## 2023-12-05 MED ORDER — SIMETHICONE 80 MG PO CHEW: 80.0000 mg | CHEWABLE_TABLET | Freq: Four times a day (QID) | ORAL | Status: DC | PRN

## 2023-12-05 MED ORDER — KETAMINE HCL 50 MG/5ML IJ SOSY
PREFILLED_SYRINGE | INTRAMUSCULAR | Status: AC
  Filled 2023-12-05: qty 5

## 2023-12-05 MED ORDER — SODIUM CHLORIDE 0.9% FLUSH: 9.0000 mL | INTRAVENOUS | Status: DC | PRN

## 2023-12-05 MED ORDER — PROPOFOL 10 MG/ML IV BOLUS
INTRAVENOUS | Status: DC | PRN
  Administered 2023-12-05: 150 mg via INTRAVENOUS

## 2023-12-05 MED ORDER — FENTANYL CITRATE (PF) 100 MCG/2ML IJ SOLN
INTRAMUSCULAR | Status: DC | PRN
  Administered 2023-12-05: 50 ug via INTRAVENOUS

## 2023-12-05 MED ORDER — CEFAZOLIN SODIUM-DEXTROSE 2-4 GM/100ML-% IV SOLN
2.0000 g | Freq: Once | INTRAVENOUS | Status: AC
  Administered 2023-12-05: 2 g via INTRAVENOUS

## 2023-12-05 MED ORDER — TAMOXIFEN CITRATE 10 MG PO TABS
20.0000 mg | ORAL_TABLET | Freq: Every day | ORAL | Status: DC
  Administered 2023-12-06: 20 mg via ORAL
  Filled 2023-12-05: qty 2

## 2023-12-05 MED ORDER — EPHEDRINE SULFATE-NACL 50-0.9 MG/10ML-% IV SOSY
PREFILLED_SYRINGE | INTRAVENOUS | Status: DC | PRN
  Administered 2023-12-05 (×2): 5 mg via INTRAVENOUS

## 2023-12-05 MED ORDER — MORPHINE SULFATE (PF) 2 MG/ML IV SOLN: 1.0000 mg | INTRAVENOUS | Status: DC | PRN

## 2023-12-05 MED ORDER — DIPHENHYDRAMINE HCL 50 MG/ML IJ SOLN: 12.5000 mg | Freq: Four times a day (QID) | INTRAMUSCULAR | Status: DC | PRN

## 2023-12-05 MED ORDER — HYDROMORPHONE HCL 1 MG/ML IJ SOLN
INTRAMUSCULAR | Status: AC
  Filled 2023-12-05: qty 1

## 2023-12-05 MED ORDER — FENTANYL CITRATE (PF) 100 MCG/2ML IJ SOLN
INTRAMUSCULAR | Status: AC
  Filled 2023-12-05: qty 2

## 2023-12-05 MED ORDER — OXYCODONE HCL 5 MG PO TABS
5.0000 mg | ORAL_TABLET | ORAL | Status: DC | PRN
  Administered 2023-12-05 – 2023-12-06 (×3): 5 mg via ORAL
  Filled 2023-12-05 (×3): qty 1

## 2023-12-05 SURGICAL SUPPLY — 43 items
BLADE SURG SZ10 CARB STEEL (BLADE) IMPLANT
CHLORAPREP W/TINT 26 (MISCELLANEOUS) ×1 IMPLANT
CNTNR URN SCR LID CUP LEK RST (MISCELLANEOUS) ×1 IMPLANT
DRAPE LAP W/FLUID (DRAPES) ×1 IMPLANT
DRAPE UNDER BUTTOCK W/FLU (DRAPES) ×1 IMPLANT
DRAPE WARM FLUID 44X44 (DRAPES) IMPLANT
DRSG TELFA 3X8 NADH STRL (GAUZE/BANDAGES/DRESSINGS) ×1 IMPLANT
ELECT BLADE 6.5 EXT (BLADE) IMPLANT
ELECTRODE EZSTD 165MM 6.5IN (MISCELLANEOUS) IMPLANT
ELECTRODE REM PT RTRN 9FT ADLT (ELECTROSURGICAL) ×1 IMPLANT
GAUZE 4X4 16PLY ~~LOC~~+RFID DBL (SPONGE) ×2 IMPLANT
GAUZE SPONGE 4X4 12PLY STRL (GAUZE/BANDAGES/DRESSINGS) ×1 IMPLANT
GLOVE SURG SYN 8.0 PF PI (GLOVE) ×2 IMPLANT
GOWN STRL REUS W/ TWL LRG LVL3 (GOWN DISPOSABLE) ×2 IMPLANT
GOWN STRL REUS W/ TWL XL LVL3 (GOWN DISPOSABLE) ×1 IMPLANT
KIT PINK PAD W/HEAD ARM REST (MISCELLANEOUS) ×1 IMPLANT
KIT TURNOVER CYSTO (KITS) ×1 IMPLANT
LABEL OR SOLS (LABEL) ×1 IMPLANT
MANIFOLD NEPTUNE II (INSTRUMENTS) ×1 IMPLANT
NDL HYPO 22X1.5 SAFETY MO (MISCELLANEOUS) ×2 IMPLANT
NEEDLE HYPO 22X1.5 SAFETY MO (MISCELLANEOUS) ×1 IMPLANT
NS IRRIG 500ML POUR BTL (IV SOLUTION) IMPLANT
PACK BASIN MAJOR ARMC (MISCELLANEOUS) ×1 IMPLANT
RETAINER VISCERA MED (MISCELLANEOUS) IMPLANT
SET CYSTO IRRIGATION (SET/KITS/TRAYS/PACK) IMPLANT
SOLN STERILE WATER 1000 ML (IV SOLUTION) ×3 IMPLANT
SOLN STERILE WATER BTL 1000 ML (IV SOLUTION) ×1 IMPLANT
SOLUTION PREP PVP 2OZ (MISCELLANEOUS) ×1 IMPLANT
SPONGE T-LAP 18X18 ~~LOC~~+RFID (SPONGE) ×2 IMPLANT
STAPLER INSORB 30 2030 C-SECTI (MISCELLANEOUS) IMPLANT
STAPLER SKIN PROX 35W (STAPLE) IMPLANT
SUT CHROMIC 2 0 CT 1 (SUTURE) ×3 IMPLANT
SUT PDS AB 1 TP1 96 (SUTURE) IMPLANT
SUT VIC AB 0 CT1 27XCR 8 STRN (SUTURE) ×2 IMPLANT
SUT VIC AB 0 CT1 36 (SUTURE) ×2 IMPLANT
SUT VIC AB 2-0 SH 27XBRD (SUTURE) IMPLANT
SUT VIC AB 3-0 SH 27X BRD (SUTURE) ×1 IMPLANT
SYR 20ML LL LF (SYRINGE) ×2 IMPLANT
SYR 30ML LL (SYRINGE) ×1 IMPLANT
SYR BULB IRRIG 60ML STRL (SYRINGE) IMPLANT
TAPE MICROFOAM 4IN (TAPE) IMPLANT
TRAP FLUID SMOKE EVACUATOR (MISCELLANEOUS) ×1 IMPLANT
TRAY FOLEY MTR SLVR 16FR STAT (SET/KITS/TRAYS/PACK) ×1 IMPLANT

## 2023-12-05 NOTE — Progress Notes (Signed)
 Pt schedule for TAH / BSO for AUB  and fibroid  Labs reviewed . All question answered . Proceed

## 2023-12-05 NOTE — Op Note (Signed)
 NAME: Annette Le, Annette Le MEDICAL RECORD NO: [id number] ACCOUNT NO: [id number] DATE OF BIRTH: 08/23/1970 FACILITY: [facility] LOCATION: [facility] PHYSICIAN: Debby DOROTHA Dinsmore, MD  Operative Report   PREOPERATIVE DIAGNOSES: 1.  Abnormal uterine bleeding. 2.  Fibroid uterus.  POSTOPERATIVE DIAGNOSES: 1.  Abnormal uterine bleeding. 2.  Fibroid uterus.  PROCEDURE:  Total abdominal hysterectomy, bilateral salpingo-oophorectomy.  SURGEON:  Debby DOROTHA Dinsmore, MD  FIRST ASSISTANT:  Garnette Mace, MD  SECOND ASSISTANT:  Recardo Dustman, PA student.  ANESTHESIA: General endotracheal anesthesia.  INDICATIONS:  This is a 53 year old female with heavy menstrual flow, blood running down her legs at some times, noted to have an enlarged uterus with a large fibroid with probable adenomyosis and fibroid.  The patient has elected for definitive surgery.   Given her history of breast cancer and age 57 in 1 month, we have decided to perform a bilateral salpingo-oophorectomy.  DESCRIPTION OF PROCEDURE:  After adequate general endotracheal anesthesia, the patient was placed in dorsal supine position with the legs in the [place] stirrups.  Lower abdomen, perineum, and vagina were prepped and draped in normal sterile fashion.   Foley catheter had been placed.  The patient had received 2 g of IV Ancef  and 500 mg Flagyl for surgical prophylaxis.  Timeout was performed.  A Pfannenstiel incision was made two fingerbreadths above the symphysis pubis.  Sharp dissection was used to  identify the fascia and the fascia was opened bilaterally and then in a transverse fashion.  Superior aspect of the fascia was grasped with Kocher clamps and the recti muscles were dissected free and inferior aspect of the fascia was grasped with Kocher  clamps and the pyramidalis muscle was dissected free.  Entry into the peritoneal cavity was accomplished sharply.  Once gaining entrance into the peritoneal cavity, an O'Connor-O'Sullivan  retractor was placed within the abdomen and the bowel was packed  cephalad with laparotomy sponges.  The uterus was then elevated and each cornua was grasped with large Kelly clamps.  The round ligaments were then bilaterally clamped, transected.  The round ligaments were bilaterally grasped and two separate 0 plain  gut sutures were applied in the mid portion of each fallopian tube and each were transected.  The infundibulopelvic ligaments were then doubly clamped bilaterally and transected and each pedicle was doubly ligated with 0 Vicryl suture.  Broad ligament  was opened and the uterine arteries were skeletonized bilaterally and a bladder flap was created.  Given the girth and the size of the uterus, approximately 15 cm and globular, the proximal portion of the uterus was then amputated leaving the cervical  stump.  The cervical stump was then grasped with two Kocher clamps and elevated and the bladder flap was then further developed with sharp and blunt dissection.  Straight Haney clamps were applied to the cervix, transected, and suture ligated.   Ultimately, curved Haney clamps were placed and the vagina was opened and the cervical stump was removed.  The vaginal cuff was then closed with interrupted 0 Vicryl suture.  Good hemostasis was noted.  Ureters were identified and both had normal  peristaltic activity.  Abdomen was copiously irrigated and good hemostasis was noted.  The sponges were removed and the O'Connor-O'Sullivan retractor was removed as well and the fascia was then closed with 0 Vicryl suture.  Two separate sutures were  used.  Fascial edges were injected with 0.5% Marcaine , 20 mL of this solution was used.  Subcutaneous tissues were irrigated and Bovied and given the depth  of the subcutaneous tissues of 4 cm, the dead space was closed with a running 2-0 chromic suture  and the skin was reapproximated with Insorb absorbable staples and additional 20 mL of Marcaine  solution was injected  beneath the skin.  SPECIMENS:  Cervix, uterus, fallopian tubes, and ovaries bilaterally.  ESTIMATED BLOOD LOSS:  250 mL.  INTRAOPERATIVE FLUIDS:  750 mL.  URINE OUTPUT:  250 mL.  DISPOSITION:  The patient tolerated the procedure well and was taken to the recovery room in good condition.   PUS D: 12/05/2023 3:10:13 pm T: 12/05/2023 6:57:00 pm  JOB: [id number]/ [id number]

## 2023-12-05 NOTE — Transfer of Care (Signed)
 Immediate Anesthesia Transfer of Care Note  Patient: Annette Le  Procedure(s) Performed: HYSTERECTOMY, ABDOMINAL, WITH SALPINGO-OOPHORECTOMY (Bilateral: Uterus)  Patient Location: PACU  Anesthesia Type:General  Level of Consciousness: drowsy and patient cooperative  Airway & Oxygen Therapy: Patient Spontanous Breathing and Patient connected to face mask oxygen  Post-op Assessment: Report given to RN and Post -op Vital signs reviewed and stable  Post vital signs: Reviewed and stable  Last Vitals:  Vitals Value Taken Time  BP 117/75 12/05/23 13:45  Temp    Pulse 68 12/05/23 13:53  Resp 19 12/05/23 13:53  SpO2 100 % 12/05/23 13:53  Vitals shown include unfiled device data.  Last Pain:  Vitals:   12/05/23 0950  TempSrc: Tympanic  PainSc: 0-No pain         Complications: No notable events documented.

## 2023-12-05 NOTE — Brief Op Note (Signed)
 12/05/2023  1:23 PM  PATIENT:  Barron Jenkins CHRISTELLA Delores  53 y.o. female  PRE-OPERATIVE DIAGNOSIS:  abnormal uterine bleeding fibroid  POST-OPERATIVE DIAGNOSIS:  abnormal uterine bleeding; fibroid PROCEDURE:  Procedure(s): HYSTERECTOMY, ABDOMINAL, WITH SALPINGO-OOPHORECTOMY (Bilateral)  SURGEON:  Surgeons and Role:    * Chriss Mannan, Debby PARAS, MD - Primary    * Leonce Garnette BIRCH, MD - Assisting  PHYSICIAN ASSISTANT: PA student Recardo Dustman   ASSISTANTS: none   ANESTHESIA:   general  EBL:  250 mL UO 250 cc , IOF 750  BLOOD ADMINISTERED:none  DRAINS: Urinary Catheter (Foley)   LOCAL MEDICATIONS USED:  MARCAINE      SPECIMEN:  Source of Specimen:  cx , uterus and bilateral tubes and ovaries   DISPOSITION OF SPECIMEN:  PATHOLOGY  COUNTS:  YES  TOURNIQUET:  * No tourniquets in log *  DICTATION: .Other Dictation: Dictation Number verbal   PLAN OF CARE: Admit to inpatient   PATIENT DISPOSITION:  PACU - hemodynamically stable.   Delay start of Pharmacological VTE agent (>24hrs) due to surgical blood loss or risk of bleeding: not applicable

## 2023-12-05 NOTE — Anesthesia Procedure Notes (Signed)
 Procedure Name: Intubation Date/Time: 12/05/2023 10:45 AM  Performed by: Ledora Duncan, CRNAPre-anesthesia Checklist: Patient identified, Emergency Drugs available, Suction available and Patient being monitored Patient Re-evaluated:Patient Re-evaluated prior to induction Oxygen Delivery Method: Circle system utilized Preoxygenation: Pre-oxygenation with 100% oxygen Induction Type: IV induction Ventilation: Mask ventilation without difficulty Laryngoscope Size: McGrath and 3 Grade View: Grade I Tube type: Oral Tube size: 6.5 mm Number of attempts: 1 Airway Equipment and Method: Stylet Placement Confirmation: ETT inserted through vocal cords under direct vision, positive ETCO2 and breath sounds checked- equal and bilateral Secured at: 21 cm Tube secured with: Tape Dental Injury: Teeth and Oropharynx as per pre-operative assessment

## 2023-12-05 NOTE — Progress Notes (Signed)
 Patient ID: Annette Le, female   DOB: 1970-03-12, 52 y.o.   MRN: [id number] Pt stable after TAH / BSO today  Good urine output VSS  Pt wants foley out and would prefer to go on oral pain meds  D/S PCA and start Roxicodone   and IV morphine for severe pain .  Labs in am

## 2023-12-05 NOTE — Anesthesia Preprocedure Evaluation (Signed)
 Anesthesia Evaluation  Patient identified by MRN, date of birth, ID band Patient awake    Reviewed: Allergy & Precautions, H&P , NPO status , Patient's Chart, lab work & pertinent test results, reviewed documented beta blocker date and time   Airway Mallampati: II  TM Distance: >3 FB Neck ROM: full    Dental  (+) Teeth Intact   Pulmonary asthma , former smoker   Pulmonary exam normal        Cardiovascular Exercise Tolerance: Good negative cardio ROS Normal cardiovascular exam Rhythm:regular Rate:Normal     Neuro/Psych  Neuromuscular disease  negative psych ROS   GI/Hepatic negative GI ROS, Neg liver ROS,,,  Endo/Other  negative endocrine ROSdiabetes, Well Controlled    Renal/GU negative Renal ROS  negative genitourinary   Musculoskeletal   Abdominal   Peds  Hematology  (+) Blood dyscrasia, anemia   Anesthesia Other Findings Past Medical History: No date: Allergy No date: Anemia No date: Asthma No date: Breast cancer (HCC)     Comment:  a.) stage IA (pT1c, pN0, cM0, G2, ER+, PR+, HER2-)               mucinous carcinoma; 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 06/29/2020: BREAST BIOPSY; Right     Comment:  us  bx at 12:00, x marker, benign 07/24/2022: BREAST BIOPSY; Left     Comment:  stereo bx, calcs, RIBBON clip-SCLEROSING ADENOSIS AND                COLUMNAR CELL CHANGE WITH ASSOCIATED CALCIFICATIONS of               the LEFT breast, medial, (ribbon clip). 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 margins 07/25/2020: BREAST LUMPECTOMY,RADIO FREQ LOCALIZER,AXILLARY SENTINEL  LYMPH NODE BIOPSY; Right     Comment:  Procedure: BREAST LUMPECTOMY,RADIO FREQ               LOCALIZER,AXILLARY SENTINEL LYMPH NODE BIOPSY;  Surgeon:               Lane Shope, MD;  Location: [facility]S;  Service:               General;  Laterality: Right;   Reproductive/Obstetrics negative OB ROS                              Anesthesia Physical Anesthesia Plan  ASA: 2  Anesthesia Plan: General ETT   Post-op Pain Management:    Induction:   PONV  Risk Score and Plan: 4 or greater  Airway Management Planned:   Additional Equipment:   Intra-op Plan:   Post-operative Plan:   Informed Consent: I have reviewed the patients History and Physical, chart, labs and discussed the procedure including the risks, benefits and alternatives for the proposed anesthesia with the patient or authorized representative who has indicated his/her understanding and acceptance.     Dental Advisory Given  Plan Discussed with: CRNA  Anesthesia Plan Comments:         Anesthesia Quick Evaluation

## 2023-12-06 ENCOUNTER — Encounter: Payer: Self-pay | Admitting: Obstetrics and Gynecology

## 2023-12-06 LAB — CBC
HCT: 29 % — ABNORMAL LOW (ref 36.0–46.0)
Hemoglobin: 9.6 g/dL — ABNORMAL LOW (ref 12.0–15.0)
MCH: 29.4 pg (ref 26.0–34.0)
MCHC: 33.1 g/dL (ref 30.0–36.0)
MCV: 88.7 fL (ref 80.0–100.0)
Platelets: 214 K/uL (ref 150–400)
RBC: 3.27 MIL/uL — ABNORMAL LOW (ref 3.87–5.11)
RDW: 13.3 % (ref 11.5–15.5)
WBC: 10.8 K/uL — ABNORMAL HIGH (ref 4.0–10.5)
nRBC: 0 % (ref 0.0–0.2)

## 2023-12-06 MED ORDER — OXYCODONE HCL 5 MG PO TABS: 5.0000 mg | ORAL_TABLET | Freq: Four times a day (QID) | ORAL | 0 refills | Status: AC | PRN

## 2023-12-06 MED ORDER — ONDANSETRON HCL 4 MG PO TABS: 4.0000 mg | ORAL_TABLET | Freq: Every day | ORAL | 1 refills | Status: AC | PRN

## 2023-12-06 MED ORDER — GABAPENTIN 300 MG PO CAPS: 300.0000 mg | ORAL_CAPSULE | Freq: Two times a day (BID) | ORAL | 0 refills | Status: AC

## 2023-12-06 NOTE — Discharge Summary (Signed)
 Physician Discharge Summary  Patient ID: Annette Le MRN: [id number] DOB/AGE: 51-Sep-1972 51 y.o.  Admit date: 12/05/2023 Discharge date: 12/06/2023  Admission Diagnoses:AUB , fibroids  Discharge Diagnoses:  Principal Problem:   Postoperative state   Discharged Condition: good  Hospital Course: underwent an uncomplicated TAH / BSO . Post op did well . Good UO , tolerated regular food .   Consults: None  Significant Diagnostic Studies: labs:  Results for orders placed or performed during the hospital encounter of 12/05/23 (from the past 24 hours)  CBC     Status: Abnormal   Collection Time: 12/06/23  6:57 AM  Result Value Ref Range   WBC 10.8 (H) 4.0 - 10.5 K/uL   RBC 3.27 (L) 3.87 - 5.11 MIL/uL   Hemoglobin 9.6 (L) 12.0 - 15.0 g/dL   HCT 70.9 (L) 63.9 - 53.9 %   MCV 88.7 80.0 - 100.0 fL   MCH 29.4 26.0 - 34.0 pg   MCHC 33.1 30.0 - 36.0 g/dL   RDW 86.6 88.4 - 84.4 %   Platelets 214 150 - 400 K/uL   nRBC 0.0 0.0 - 0.2 %     Treatments: surgery: as above  Discharge Exam: Blood pressure 112/68, pulse 81, temperature 98.6 F (37 C), temperature source Oral, resp. rate 18, last menstrual period 11/27/2023, SpO2 98%. Head: Normocephalic, without obvious abnormality, atraumatic Resp: clear to auscultation bilaterally Cardio: regular rate and rhythm, S1, S2 normal, no murmur, click, rub or gallop GI: soft, non-tender; bowel sounds normal; no masses,  no organomegaly Incision covered  Disposition: Discharge disposition: 01-Home or Self Care       Discharge Instructions     Call MD for:  difficulty breathing, headache or visual disturbances   Complete by: As directed    Call MD for:  extreme fatigue   Complete by: As directed    Call MD for:  hives   Complete by: As directed    Call MD for:  persistant dizziness or light-headedness   Complete by: As directed    Call MD for:  persistant nausea and vomiting   Complete by: As directed    Call MD for:   redness, tenderness, or signs of infection (pain, swelling, redness, odor or green/yellow discharge around incision site)   Complete by: As directed    Call MD for:  severe uncontrolled pain   Complete by: As directed    Call MD for:  temperature >100.4   Complete by: As directed    Diet - low sodium heart healthy   Complete by: As directed    Increase activity slowly   Complete by: As directed    Leave dressing on - Keep it clean, dry, and intact until clinic visit   Complete by: As directed    Remove opsite dressing at home in 10 days      Allergies as of 12/06/2023       Reactions   Shrimp Extract Hives   Ibuprofen  Hives   Specifically Advil    Shrimp [shellfish Allergy] Hives   Tylenol [acetaminophen] Hives   Other Rash   bandaid        Medication List     TAKE these medications    Caltrate 600+D3 600-800 MG-UNIT Tabs Generic drug: Calcium  Carb-Cholecalciferol Take 1 tablet by mouth daily.   gabapentin  300 MG capsule Commonly known as: Neurontin  Take 1 capsule (300 mg total) by mouth 2 (two) times daily.   Iron -Vitamin C  65-125 MG Tabs Take 1  tablet by mouth daily at 2 PM.   ondansetron  4 MG tablet Commonly known as: Zofran  Take 1 tablet (4 mg total) by mouth daily as needed for nausea or vomiting.   oxyCODONE  5 MG immediate release tablet Commonly known as: Roxicodone  Take 1 tablet (5 mg total) by mouth every 6 (six) hours as needed for moderate pain (pain score 4-6).   tamoxifen  20 MG tablet Commonly known as: NOLVADEX  Take 1 tablet (20 mg total) by mouth daily.   VITAMIN E PO Take 1 capsule by mouth daily.               Discharge Care Instructions  (From admission, onward)           Start     Ordered   12/06/23 0000  Leave dressing on - Keep it clean, dry, and intact until clinic visit       Comments: Remove opsite dressing at home in 10 days   12/06/23 1011            Follow-up Information     Irja Wheless, Debby PARAS, MD  Follow up in 2 week(s).   Specialty: Obstetrics and Gynecology Why: post op Contact information: [address] [place] [address] [phone]                 Signed: Debby PARAS Bonner Larue 12/06/2023, 10:12 AM

## 2023-12-06 NOTE — Progress Notes (Signed)
 Patient discharged. Discharge instructions given. Patient verbalizes understanding. Transported by axillary.

## 2023-12-06 NOTE — Anesthesia Postprocedure Evaluation (Signed)
 Anesthesia Post Note  Patient: Annette Le  Procedure(s) Performed: HYSTERECTOMY, ABDOMINAL, WITH SALPINGO-OOPHORECTOMY (Bilateral: Uterus)  Patient location during evaluation: PACU Anesthesia Type: General Level of consciousness: awake and alert Pain management: pain level controlled Vital Signs Assessment: post-procedure vital signs reviewed and stable Respiratory status: spontaneous breathing, nonlabored ventilation, respiratory function stable and patient connected to nasal cannula oxygen Cardiovascular status: blood pressure returned to baseline and stable Postop Assessment: no apparent nausea or vomiting Anesthetic complications: no   No notable events documented.   Last Vitals:  Vitals:   12/06/23 0336 12/06/23 0752  BP: 115/71 112/68  Pulse: 83 81  Resp: 18 18  Temp: 37.8 C 37 C  SpO2: 99% 98%    Last Pain:  Vitals:   12/06/23 0755  TempSrc:   PainSc: 4                  Lynwood [name]

## 2023-12-09 LAB — SURGICAL PATHOLOGY

## 2024-03-27 NOTE — Progress Notes (Signed)
 Annette Le                                          MRN: [id number]   03/27/2024   The [facility] Quality Team Specialist reviewed this patient medical record for the purposes of chart review for care gap closure. The following were reviewed: chart review for care gap closure-glycemic status assessment.    [facility] Quality Team

## 2024-05-18 ENCOUNTER — Ambulatory Visit: Admitting: Oncology

## 2024-05-18 ENCOUNTER — Other Ambulatory Visit
